# Patient Record
Sex: Female | Born: 1945 | Race: White | Hispanic: No | Marital: Married | Smoking: Never smoker
Health system: Southern US, Community
[De-identification: ages and names within clinical notes are randomized; demographics above are authoritative.]

## PROBLEM LIST (undated history)

## (undated) DIAGNOSIS — I10 Essential (primary) hypertension: Secondary | ICD-10-CM

## (undated) DIAGNOSIS — M351 Other overlap syndromes: Secondary | ICD-10-CM

## (undated) DIAGNOSIS — K219 Gastro-esophageal reflux disease without esophagitis: Secondary | ICD-10-CM

## (undated) HISTORY — PX: HEMORROIDECTOMY: SUR656

## (undated) HISTORY — PX: ABDOMINAL HYSTERECTOMY: SHX81

---

## 1997-10-07 ENCOUNTER — Other Ambulatory Visit: Admission: RE | Admit: 1997-10-07 | Discharge: 1997-10-07 | Payer: Self-pay | Admitting: *Deleted

## 2000-07-19 ENCOUNTER — Encounter: Payer: Self-pay | Admitting: Family Medicine

## 2000-07-19 ENCOUNTER — Encounter: Admission: RE | Admit: 2000-07-19 | Discharge: 2000-07-19 | Payer: Self-pay | Admitting: Family Medicine

## 2000-10-02 ENCOUNTER — Ambulatory Visit (HOSPITAL_COMMUNITY): Admission: RE | Admit: 2000-10-02 | Discharge: 2000-10-02 | Payer: Self-pay | Admitting: Gastroenterology

## 2000-11-14 ENCOUNTER — Other Ambulatory Visit: Admission: RE | Admit: 2000-11-14 | Discharge: 2000-11-14 | Payer: Self-pay | Admitting: *Deleted

## 2000-11-24 ENCOUNTER — Encounter: Payer: Self-pay | Admitting: Family Medicine

## 2000-11-24 ENCOUNTER — Encounter: Admission: RE | Admit: 2000-11-24 | Discharge: 2000-11-24 | Payer: Self-pay | Admitting: Family Medicine

## 2001-03-07 ENCOUNTER — Encounter: Payer: Self-pay | Admitting: Family Medicine

## 2001-03-07 ENCOUNTER — Encounter: Admission: RE | Admit: 2001-03-07 | Discharge: 2001-03-07 | Payer: Self-pay | Admitting: Family Medicine

## 2001-07-03 ENCOUNTER — Encounter: Admission: RE | Admit: 2001-07-03 | Discharge: 2001-07-03 | Payer: Self-pay | Admitting: Family Medicine

## 2001-07-03 ENCOUNTER — Encounter: Payer: Self-pay | Admitting: Family Medicine

## 2001-07-25 ENCOUNTER — Inpatient Hospital Stay (HOSPITAL_COMMUNITY): Admission: RE | Admit: 2001-07-25 | Discharge: 2001-07-27 | Payer: Self-pay | Admitting: *Deleted

## 2003-01-09 ENCOUNTER — Ambulatory Visit (HOSPITAL_COMMUNITY): Admission: RE | Admit: 2003-01-09 | Discharge: 2003-01-09 | Payer: Self-pay | Admitting: *Deleted

## 2003-10-30 ENCOUNTER — Ambulatory Visit (HOSPITAL_COMMUNITY): Admission: RE | Admit: 2003-10-30 | Discharge: 2003-10-30 | Payer: Self-pay | Admitting: Gastroenterology

## 2004-11-08 IMAGING — CT CT CHEST W/ CM
1 series · 16 of 32 positions shown, 20 images · IV contrast ([ID] OMNI 300)
Comparison: none

CLINICAL DATA: Chest pain.  Borderline hypertension. 
 CT SCAN OF THE CHEST WITH CONTRAST
 After the intravenous injection of 100 cc of Omnipaque 300, a series of scans of the entire chest are made without previous films for comparison and show multiple gallstones within the gallbladder.  These gallstones could possibly be accounting for pain in the right upper chest or posterior subscapular area.  
 The heart is mildly enlarged.  There is no pericardial or pleural effusion.  The lungs and tracheobronchial tree appear normal.  The bony thorax appears normal except for small anterior hypertrophic spurs.  No abnormal lymph nodes are seen.  The aorta and other major vessels appear normal.  
 IMPRESSION
 Multiple gallstones within the gallbladder. 
 Mild cardiomegaly.  No significant abnormality of the lungs, major vessels or bony thorax.

[Series 2: routine chest · axial · 0.62mm/px · z∈[-344,-69]mm · 16 of 61 slices shown, 20 images]
[im 3/61  mediastinal]
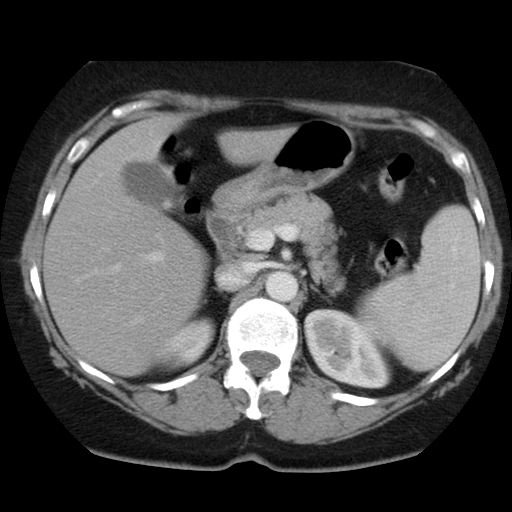
[im 3/61  lung]
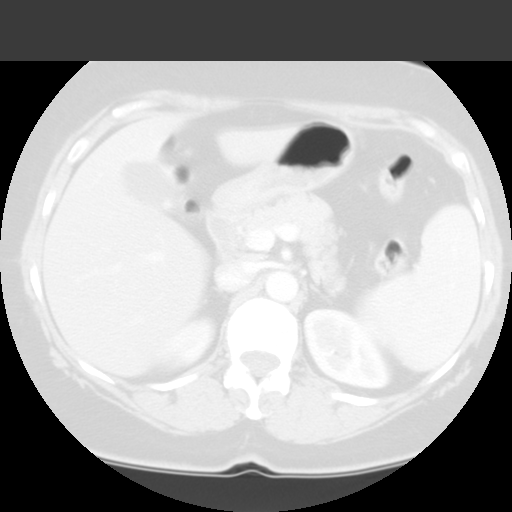
[im 7/61  lung]
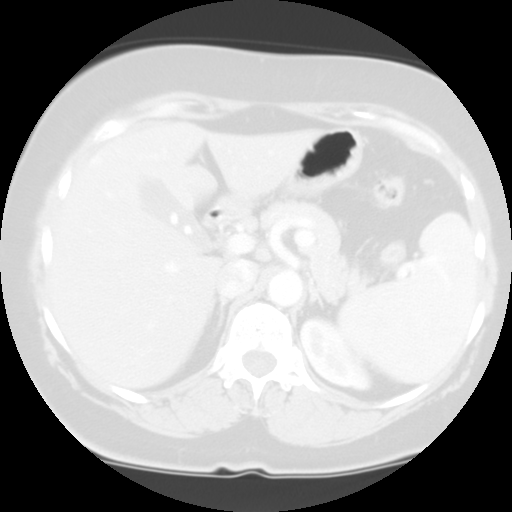
[im 12/61  lung]
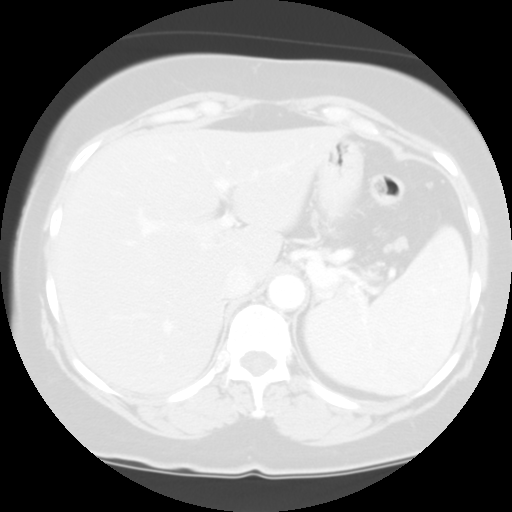
[im 14/61  lung]
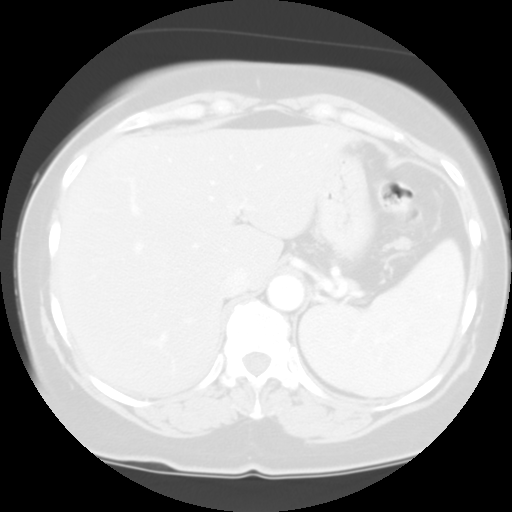
[im 18/61  mediastinal]
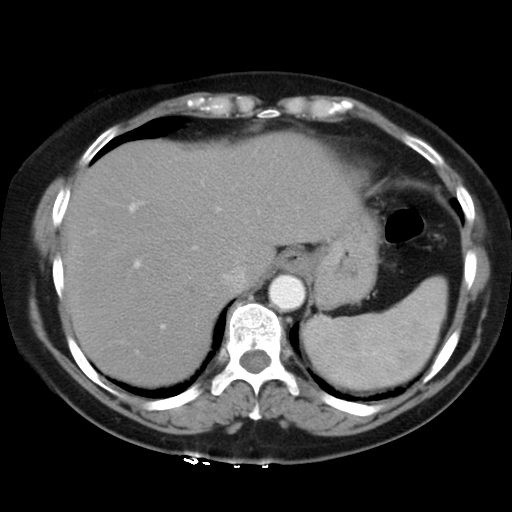
[im 18/61  lung]
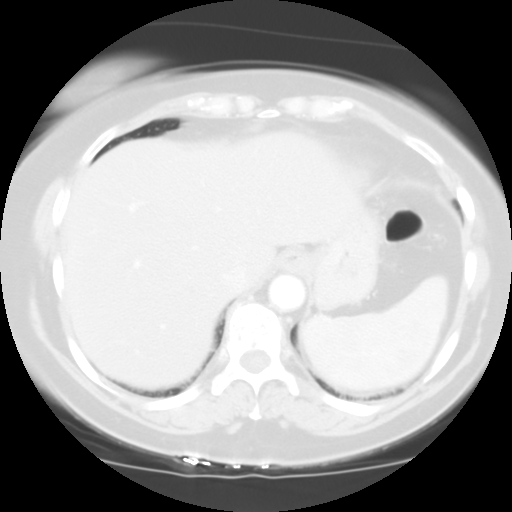
[im 23/61  lung]
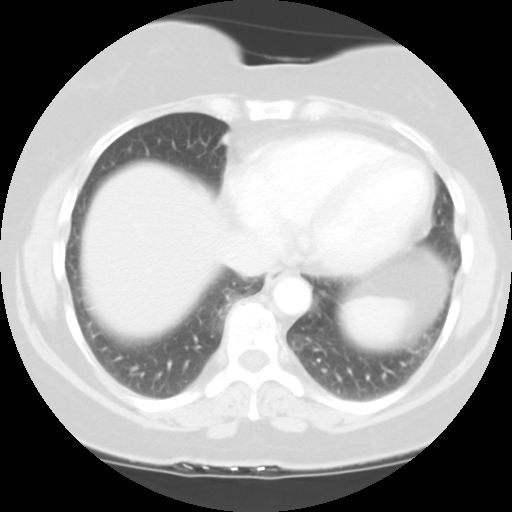
[im 27/61  lung]
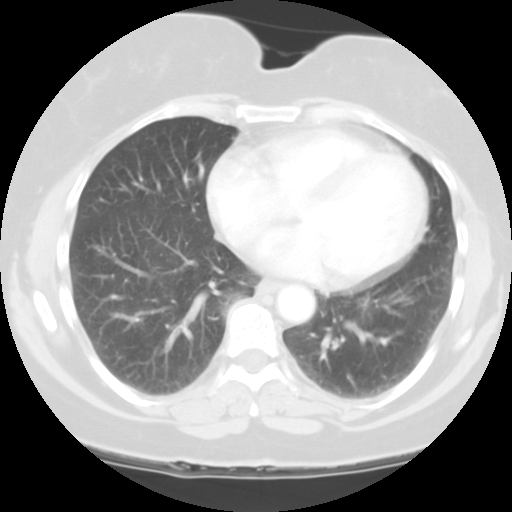
[im 30/61  lung]
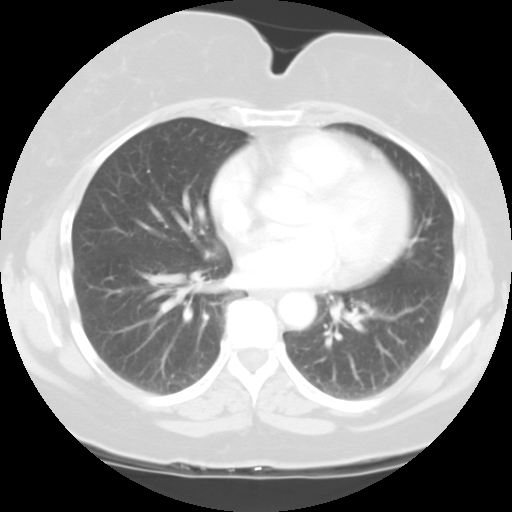
[im 32/61  mediastinal]
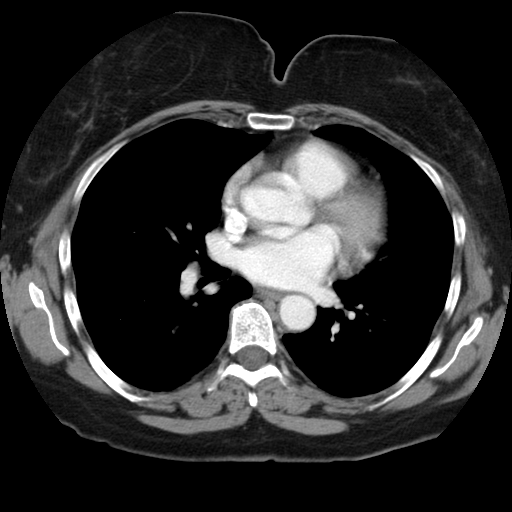
[im 32/61  lung]
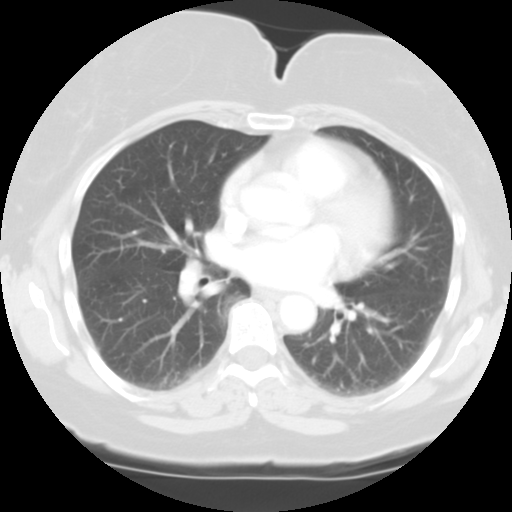
[im 34/61  lung]
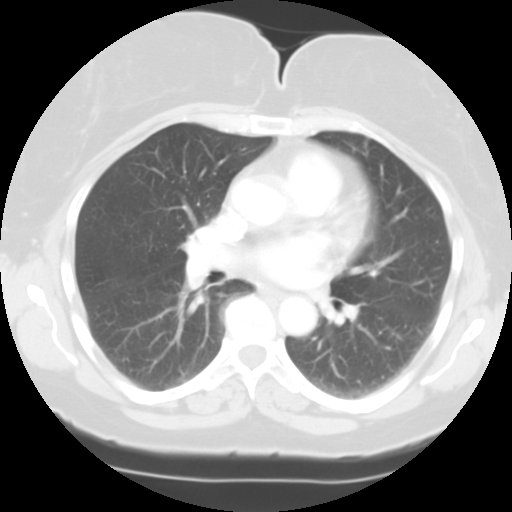
[im 37/61  lung]
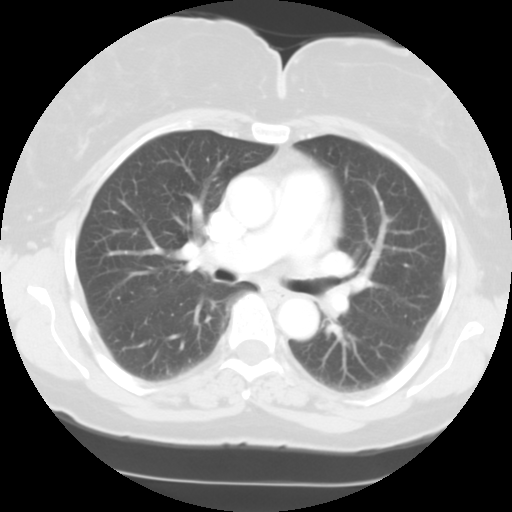
[im 41/61  lung]
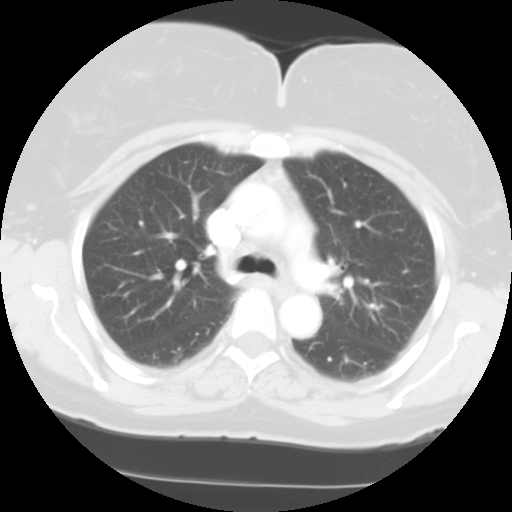
[im 45/61  mediastinal]
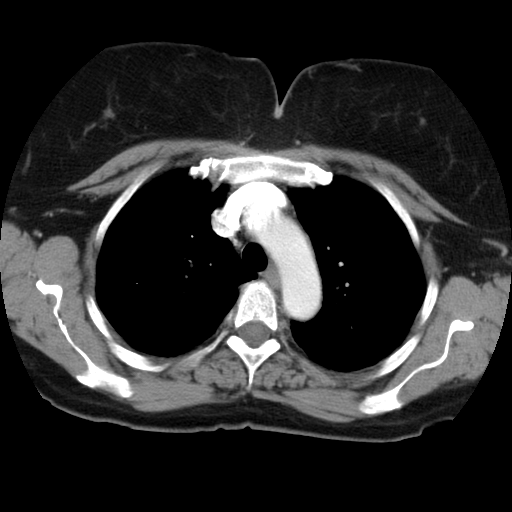
[im 45/61  lung]
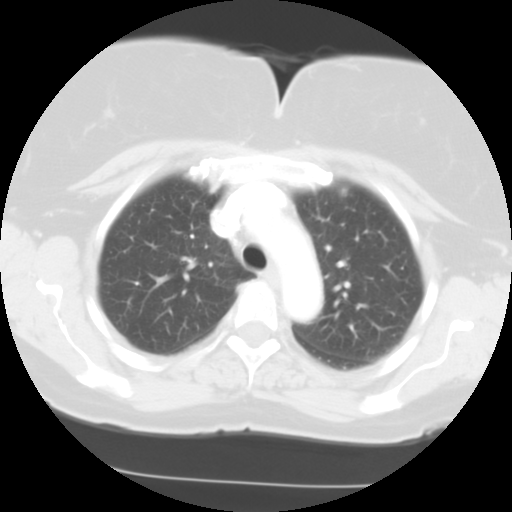
[im 49/61  lung]
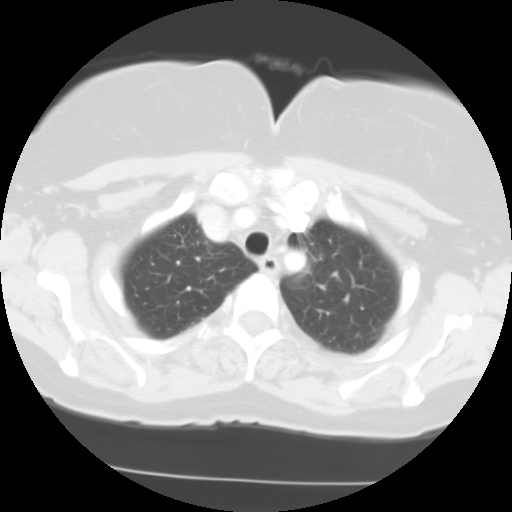
[im 54/61  lung]
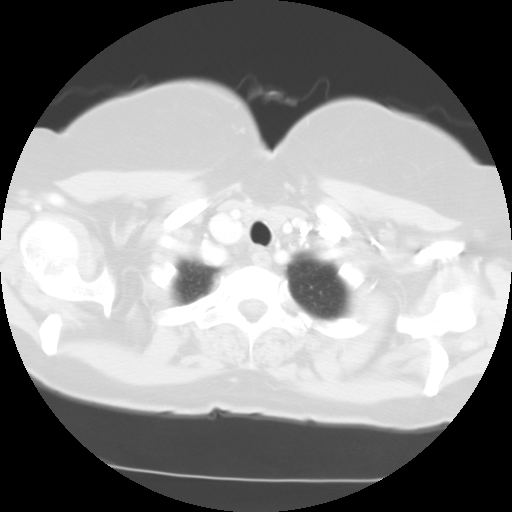
[im 58/61  lung]
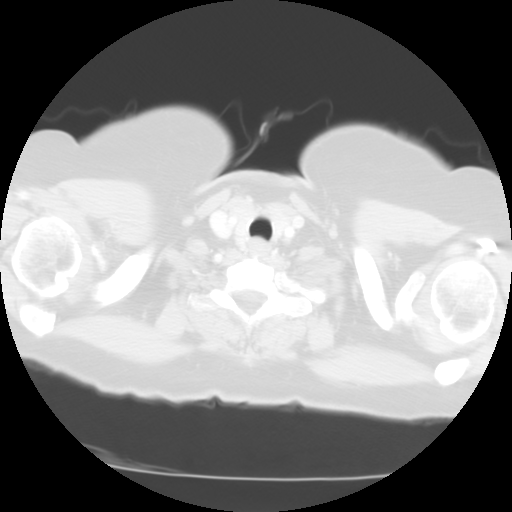

[16 of 32 positions shown; findings below may reference images not displayed]

## 2020-10-28 ENCOUNTER — Encounter (HOSPITAL_COMMUNITY): Payer: Self-pay

## 2020-10-28 ENCOUNTER — Emergency Department (HOSPITAL_COMMUNITY): Payer: Medicare Other

## 2020-10-28 ENCOUNTER — Other Ambulatory Visit: Payer: Self-pay

## 2020-10-28 ENCOUNTER — Observation Stay (HOSPITAL_COMMUNITY)
Admission: EM | Admit: 2020-10-28 | Discharge: 2020-10-30 | Disposition: A | Discharge status: Home / Self Care | Payer: Medicare Other | Attending: Internal Medicine | Admitting: Internal Medicine

## 2020-10-28 DIAGNOSIS — I2510 Atherosclerotic heart disease of native coronary artery without angina pectoris: Secondary | ICD-10-CM | POA: Diagnosis not present

## 2020-10-28 DIAGNOSIS — Z20822 Contact with and (suspected) exposure to covid-19: Secondary | ICD-10-CM | POA: Diagnosis not present

## 2020-10-28 DIAGNOSIS — Z79899 Other long term (current) drug therapy: Secondary | ICD-10-CM | POA: Insufficient documentation

## 2020-10-28 DIAGNOSIS — M351 Other overlap syndromes: Secondary | ICD-10-CM

## 2020-10-28 DIAGNOSIS — Z951 Presence of aortocoronary bypass graft: Secondary | ICD-10-CM | POA: Diagnosis not present

## 2020-10-28 DIAGNOSIS — E039 Hypothyroidism, unspecified: Secondary | ICD-10-CM | POA: Diagnosis not present

## 2020-10-28 DIAGNOSIS — Z7982 Long term (current) use of aspirin: Secondary | ICD-10-CM | POA: Diagnosis not present

## 2020-10-28 DIAGNOSIS — J45909 Unspecified asthma, uncomplicated: Secondary | ICD-10-CM | POA: Diagnosis not present

## 2020-10-28 DIAGNOSIS — R079 Chest pain, unspecified: Secondary | ICD-10-CM | POA: Diagnosis not present

## 2020-10-28 DIAGNOSIS — I10 Essential (primary) hypertension: Secondary | ICD-10-CM | POA: Diagnosis not present

## 2020-10-28 DIAGNOSIS — R0789 Other chest pain: Principal | ICD-10-CM | POA: Insufficient documentation

## 2020-10-28 HISTORY — DX: Gastro-esophageal reflux disease without esophagitis: K21.9

## 2020-10-28 HISTORY — DX: Other overlap syndromes: M35.1

## 2020-10-28 HISTORY — DX: Essential (primary) hypertension: I10

## 2020-10-28 LAB — CBC WITH DIFFERENTIAL/PLATELET
Abs Immature Granulocytes: 0.03 10*3/uL (ref 0.00–0.07)
Basophils Absolute: 0.1 10*3/uL (ref 0.0–0.1)
Basophils Relative: 1 %
Eosinophils Absolute: 0.2 10*3/uL (ref 0.0–0.5)
Eosinophils Relative: 2 %
HCT: 38.9 % (ref 36.0–46.0)
Hemoglobin: 12.6 g/dL (ref 12.0–15.0)
Immature Granulocytes: 0 %
Lymphocytes Relative: 19 %
Lymphs Abs: 1.7 10*3/uL (ref 0.7–4.0)
MCH: 28.9 pg (ref 26.0–34.0)
MCHC: 32.4 g/dL (ref 30.0–36.0)
MCV: 89.2 fL (ref 80.0–100.0)
Monocytes Absolute: 0.8 10*3/uL (ref 0.1–1.0)
Monocytes Relative: 9 %
Neutro Abs: 6 10*3/uL (ref 1.7–7.7)
Neutrophils Relative %: 69 %
Platelets: 241 10*3/uL (ref 150–400)
RBC: 4.36 MIL/uL (ref 3.87–5.11)
RDW: 14.6 % (ref 11.5–15.5)
WBC: 8.8 10*3/uL (ref 4.0–10.5)
nRBC: 0 % (ref 0.0–0.2)

## 2020-10-28 LAB — RESP PANEL BY RT-PCR (FLU A&B, COVID) ARPGX2
Influenza A by PCR: NEGATIVE
Influenza B by PCR: NEGATIVE
SARS Coronavirus 2 by RT PCR: NEGATIVE

## 2020-10-28 LAB — COMPREHENSIVE METABOLIC PANEL
ALT: 33 U/L (ref 0–44)
AST: 27 U/L (ref 15–41)
Albumin: 3.7 g/dL (ref 3.5–5.0)
Alkaline Phosphatase: 82 U/L (ref 38–126)
Anion gap: 7 (ref 5–15)
BUN: 10 mg/dL (ref 8–23)
CO2: 26 mmol/L (ref 22–32)
Calcium: 9.3 mg/dL (ref 8.9–10.3)
Chloride: 104 mmol/L (ref 98–111)
Creatinine, Ser: 0.58 mg/dL (ref 0.44–1.00)
GFR, Estimated: >60 mL/min (ref >60)
Glucose, Bld: 93 mg/dL (ref 70–99)
Potassium: 4.3 mmol/L (ref 3.5–5.1)
Sodium: 137 mmol/L (ref 135–145)
Total Bilirubin: 1.1 mg/dL (ref 0.3–1.2)
Total Protein: 7 g/dL (ref 6.5–8.1)

## 2020-10-28 LAB — TROPONIN I (HIGH SENSITIVITY)
Troponin I (High Sensitivity): 5 ng/L (ref <18)
Troponin I (High Sensitivity): 6 ng/L (ref <18)

## 2020-10-28 MED ORDER — MOMETASONE FURO-FORMOTEROL FUM 200-5 MCG/ACT IN AERO
2.0000 | INHALATION_SPRAY | Freq: Two times a day (BID) | RESPIRATORY_TRACT | Status: DC
  Administered 2020-10-28 – 2020-10-30 (×4): 2 via RESPIRATORY_TRACT
  Filled 2020-10-28: qty 8.8

## 2020-10-28 MED ORDER — LEVOTHYROXINE SODIUM 88 MCG PO TABS
88.0000 ug | ORAL_TABLET | Freq: Every day | ORAL | Status: DC
  Administered 2020-10-29 – 2020-10-30 (×2): 88 ug via ORAL
  Filled 2020-10-28 (×2): qty 1

## 2020-10-28 MED ORDER — MONTELUKAST SODIUM 10 MG PO TABS
10.0000 mg | ORAL_TABLET | Freq: Every day | ORAL | Status: DC
  Administered 2020-10-28 – 2020-10-29 (×2): 10 mg via ORAL
  Filled 2020-10-28 (×3): qty 1

## 2020-10-28 MED ORDER — NITROGLYCERIN 2 % TD OINT
1.0000 [in_us] | TOPICAL_OINTMENT | Freq: Four times a day (QID) | TRANSDERMAL | Status: DC
  Administered 2020-10-28 – 2020-10-30 (×6): 1 [in_us] via TOPICAL
  Filled 2020-10-28 (×2): qty 1
  Filled 2020-10-28: qty 30
  Filled 2020-10-28: qty 1

## 2020-10-28 MED ORDER — ONDANSETRON HCL 4 MG/2ML IJ SOLN: 4.0000 mg | Freq: Four times a day (QID) | INTRAMUSCULAR | Status: DC | PRN

## 2020-10-28 MED ORDER — AZELASTINE HCL 0.1 % NA SOLN
1.0000 | Freq: Two times a day (BID) | NASAL | Status: DC
  Administered 2020-10-30: 1 via NASAL

## 2020-10-28 MED ORDER — AMLODIPINE BESYLATE 5 MG PO TABS
5.0000 mg | ORAL_TABLET | Freq: Every day | ORAL | Status: DC
  Administered 2020-10-29 – 2020-10-30 (×2): 5 mg via ORAL
  Filled 2020-10-28 (×2): qty 1

## 2020-10-28 MED ORDER — IRBESARTAN 300 MG PO TABS
150.0000 mg | ORAL_TABLET | Freq: Every day | ORAL | Status: DC
  Administered 2020-10-30: 150 mg via ORAL
  Filled 2020-10-28 (×2): qty 1

## 2020-10-28 MED ORDER — ASPIRIN 325 MG PO TABS
325.0000 mg | ORAL_TABLET | Freq: Once | ORAL | Status: AC
  Administered 2020-10-28: 325 mg via ORAL
  Filled 2020-10-28: qty 1

## 2020-10-28 MED ORDER — VITAMIN D 25 MCG (1000 UNIT) PO TABS
1000.0000 [IU] | ORAL_TABLET | Freq: Every day | ORAL | Status: DC
  Administered 2020-10-29 – 2020-10-30 (×2): 1000 [IU] via ORAL
  Filled 2020-10-28 (×2): qty 1

## 2020-10-28 MED ORDER — ACETAMINOPHEN 325 MG PO TABS
325.0000 mg | ORAL_TABLET | Freq: Once | ORAL | Status: AC
  Administered 2020-10-28: 325 mg via ORAL
  Filled 2020-10-28: qty 1

## 2020-10-28 MED ORDER — ASPIRIN 81 MG PO CHEW
81.0000 mg | CHEWABLE_TABLET | Freq: Every day | ORAL | Status: DC
  Administered 2020-10-29 – 2020-10-30 (×2): 81 mg via ORAL
  Filled 2020-10-28 (×2): qty 1

## 2020-10-28 MED ORDER — ENOXAPARIN SODIUM 40 MG/0.4ML IJ SOSY
40.0000 mg | PREFILLED_SYRINGE | INTRAMUSCULAR | Status: DC
  Administered 2020-10-28 – 2020-10-29 (×2): 40 mg via SUBCUTANEOUS
  Filled 2020-10-28 (×2): qty 0.4

## 2020-10-28 MED ORDER — NITROGLYCERIN 0.4 MG SL SUBL
0.4000 mg | SUBLINGUAL_TABLET | Freq: Once | SUBLINGUAL | Status: AC
  Administered 2020-10-28: 0.4 mg via SUBLINGUAL
  Filled 2020-10-28: qty 1

## 2020-10-28 MED ORDER — IOHEXOL 350 MG/ML SOLN
75.0000 mL | Freq: Once | INTRAVENOUS | Status: AC | PRN
  Administered 2020-10-28: 75 mL via INTRAVENOUS

## 2020-10-28 MED ORDER — ACETAMINOPHEN 325 MG PO TABS
650.0000 mg | ORAL_TABLET | ORAL | Status: DC | PRN
  Administered 2020-10-29: 650 mg via ORAL
  Filled 2020-10-28: qty 2

## 2020-10-28 MED ORDER — PANTOPRAZOLE SODIUM 40 MG PO TBEC
40.0000 mg | DELAYED_RELEASE_TABLET | Freq: Every day | ORAL | Status: DC
  Administered 2020-10-29 – 2020-10-30 (×2): 40 mg via ORAL
  Filled 2020-10-28 (×2): qty 1

## 2020-10-28 MED ORDER — SERTRALINE HCL 50 MG PO TABS
25.0000 mg | ORAL_TABLET | Freq: Every day | ORAL | Status: DC
  Administered 2020-10-29 – 2020-10-30 (×2): 25 mg via ORAL
  Filled 2020-10-28 (×2): qty 1

## 2020-10-28 MED ORDER — HYDROXYCHLOROQUINE SULFATE 200 MG PO TABS
200.0000 mg | ORAL_TABLET | Freq: Every day | ORAL | Status: DC
  Administered 2020-10-29 – 2020-10-30 (×2): 200 mg via ORAL
  Filled 2020-10-28 (×2): qty 1

## 2020-10-28 NOTE — ED Notes (Signed)
Dr. Gardner at bedside 

## 2020-10-28 NOTE — ED Notes (Signed)
Pt given turkey sandwich bag 

## 2020-10-28 NOTE — ED Triage Notes (Signed)
Pt reports right sided chest pain since Sunday. Seen at [facility] and sent here for further eval. Pain sometimes go to her back.

## 2020-10-28 NOTE — H&P (Signed)
History and Physical    Catherine Wang EQA:[id number] DOB: [birth year] DOA: 10/28/2020  PCP: Pcp, No  Patient coming from: Home  I have personally briefly reviewed patient's old medical records in [facility]  Chief Complaint: CP  HPI: Catherine Wang is a 75 y.o. 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  No h/o CAD though her mother had h/o CAD and CABG (not sure what age).  Pt presents to ED with c/o CP.  CP is right sided, onset gradually on Sunday, constant since onset.  Radiates to back.  Positional and pleuritic in nature.  Pt has associated random episodes of significant diaphoresis where she has to change her clothes.  Did just fly back from [place] last week.  No recent illness, fevers, chills, leg pain, swelling.   ED Course: Pain initially persistent in ED, now still present but significantly improved after SL NTG.  Trop neg x2.  CTA chest: neg for PE, does have scattered coronary artery calcifications.   Review of Systems: As per HPI, otherwise all review of systems negative.  Past Medical History:  Diagnosis Date   GERD (gastroesophageal reflux disease)    Hypertension    Mixed connective tissue disease (HCC)     History reviewed. No pertinent surgical history.   reports that she does not drink alcohol and does not use drugs. No history on file for tobacco use.  No Known Allergies  Family History  Problem Relation Age of Onset   CAD Mother      Prior to Admission medications   Medication Sig Start Date End Date Taking? Authorizing Provider  amLODipine (NORVASC) 5 MG tablet Take 5 mg by mouth daily. 05/18/15  Yes [provider]  ARTIFICIAL TEAR OP Place 1 drop into both eyes daily as needed (For dry eyes).   Yes [provider]  aspirin 81 MG chewable tablet Chew 81 mg by mouth daily.   Yes [provider]  azelastine (ASTELIN) 0.1 % nasal spray Place 1 spray  into both nostrils 2 (two) times daily.   Yes [provider]  Cholecalciferol 25 MCG (1000 UT) capsule Take 1,000 Units by mouth daily.   Yes [provider]  Ferrous Sulfate (IRON PO) Take 1 tablet by mouth daily.   Yes [provider]  fluticasone-salmeterol (ADVAIR HFA) 230-21 MCG/ACT inhaler Inhale 2 puffs into the lungs 2 (two) times daily.   Yes [provider]  hydroxychloroquine (PLAQUENIL) 200 MG tablet Take 200-400 mg by mouth See admin instructions. Alternate days. Patient states she takes one tablet by mouth daily and then take two tablets by mouth the next day per patient. Patient stated she taken two tablets by mouth yesterday(10-27-20) 07/08/15  Yes [provider]  levothyroxine (SYNTHROID) 88 MCG tablet Take 88 mcg by mouth daily before breakfast.   Yes [provider]  losartan (COZAAR) 25 MG tablet Take 25 mg by mouth daily. 03/13/15  Yes [provider]  montelukast (SINGULAIR) 10 MG tablet Take 10 mg by mouth at bedtime.   Yes [provider]  olmesartan (BENICAR) 20 MG tablet Take 20 mg by mouth daily.   Yes [provider]  Omega-3 Fatty Acids (FISH OIL PO) Take 1 tablet by mouth daily.   Yes [provider]  pantoprazole (PROTONIX) 40 MG tablet Take 40 mg by mouth daily.   Yes [provider]  sertraline (ZOLOFT) 25 MG tablet Take 25 mg by mouth  daily.   Yes [provider]    Physical Exam: Vitals:   10/28/20 1900 10/28/20 2000 10/28/20 2005 10/28/20 2045  BP: (!) 156/79 (!) 155/112 (!) 181/79 (!) 171/85  Pulse: 69 71 71 73  Resp: 15 19 18 17   Temp:      TempSrc:      SpO2: 96% 95% 99% 96%    Constitutional: NAD, calm, comfortable Eyes: PERRL, lids and conjunctivae normal ENMT: Mucous membranes are moist. Posterior pharynx clear of any exudate or lesions.Normal dentition.  Neck: normal, supple, no masses, no thyromegaly Respiratory: clear to auscultation  bilaterally, no wheezing, no crackles. Normal respiratory effort. No accessory muscle use.  Cardiovascular: Regular rate and rhythm, no murmurs / rubs / gallops. No extremity edema. 2+ pedal pulses. No carotid bruits.  Abdomen: no tenderness, no masses palpated. No hepatosplenomegaly. Bowel sounds positive.  Musculoskeletal: no clubbing / cyanosis. No joint deformity upper and lower extremities. Good ROM, no contractures. Normal muscle tone.  Skin: no rashes, lesions, ulcers. No induration Neurologic: CN 2-12 grossly intact. Sensation intact, DTR normal. Strength 5/5 in all 4.  Psychiatric: Normal judgment and insight. Alert and oriented x 3. ? Mild memory deficits (frequently looks to husband to supply answers to some questions).   Labs on Admission: I have personally reviewed following labs and imaging studies  CBC: Recent Labs  Lab 10/28/20 1416  WBC 8.8  NEUTROABS 6.0  HGB 12.6  HCT 38.9  MCV 89.2  PLT 241   Basic Metabolic Panel: Recent Labs  Lab 10/28/20 1416  NA 137  K 4.3  CL 104  CO2 26  GLUCOSE 93  BUN 10  CREATININE 0.58  CALCIUM 9.3   GFR: CrCl cannot be calculated (Unknown ideal weight.). Liver Function Tests: Recent Labs  Lab 10/28/20 1416  AST 27  ALT 33  ALKPHOS 82  BILITOT 1.1  PROT 7.0  ALBUMIN 3.7   No results for input(s): LIPASE, AMYLASE in the last 168 hours. No results for input(s): AMMONIA in the last 168 hours. Coagulation Profile: No results for input(s): INR, PROTIME in the last 168 hours. Cardiac Enzymes: No results for input(s): CKTOTAL, CKMB, CKMBINDEX, TROPONINI in the last 168 hours. BNP (last 3 results) No results for input(s): PROBNP in the last 8760 hours. HbA1C: No results for input(s): HGBA1C in the last 72 hours. CBG: No results for input(s): GLUCAP in the last 168 hours. Lipid Profile: No results for input(s): CHOL, HDL, LDLCALC, TRIG, CHOLHDL, LDLDIRECT in the last 72 hours. Thyroid Function Tests: No results for  input(s): TSH, T4TOTAL, FREET4, T3FREE, THYROIDAB in the last 72 hours. Anemia Panel: No results for input(s): VITAMINB12, FOLATE, FERRITIN, TIBC, IRON, RETICCTPCT in the last 72 hours. Urine analysis: No results found for: COLORURINE, APPEARANCEUR, LABSPEC, PHURINE, GLUCOSEU, HGBUR, BILIRUBINUR, KETONESUR, PROTEINUR, UROBILINOGEN, NITRITE, LEUKOCYTESUR  Radiological Exams on Admission: DG Chest 2 View  Result Date: 10/28/2020 CLINICAL DATA:  Chest pain EXAM: CHEST - 2 VIEW COMPARISON:  Chest CT dated December 09, 2002 FINDINGS: Heart is upper limits of normal in size. Indeterminate rounded density located between the left lateral trachea and aortic arch. Bibasilar atelectasis. No focal consolidation. Blunting of the bilateral costophrenic angles possibly due to trace pleural effusions versus pleural thickening. IMPRESSION: Indeterminate rounded density located between the left lateral trachea and aortic arch. Recommend further evaluation with chest CT. Electronically Signed   By: December 11, 2002 M.D.   On: 10/28/2020 15:11   CT Angio Chest PE W and/or Wo Contrast  Addendum Date: 10/28/2020   ADDENDUM REPORT: 10/28/2020 19:35 ADDENDUM: Request made to specifically address the abnormal finding on radiography. There is no mass or adenopathy to correspond to the described left paratracheal opacity on radiography. Suspect that the finding was due to confluence of vascular shadows. 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 Electronically Signed   By: Jasmine PangKim  Fujinaga M.D.   On: 10/28/2020 19:35   Result Date: 10/28/2020 CLINICAL DATA:  RIGHT-side chest pain since Sunday, some pain goes to back, no history of blood clots. History hypertension, asthma EXAM: CT ANGIOGRAPHY CHEST WITH CONTRAST TECHNIQUE: Multidetector CT imaging of the chest was performed using the standard protocol during bolus administration of intravenous contrast. Multiplanar CT image reconstructions and MIPs were obtained to evaluate  the vascular anatomy. CONTRAST:  75mL OMNIPAQUE IOHEXOL 350 MG/ML SOLN IV COMPARISON:  CT chest 01/09/2003 FINDINGS: Cardiovascular: Atherosclerotic calcifications aorta, coronary arteries, proximal great vessels. And normal caliber without aneurysm or dissection. Heart unremarkable. No pericardial effusion. Pulmonary arteries well opacified and patent. No evidence of pulmonary embolism. Mediastinum/Nodes: Esophagus unremarkable. No thoracic adenopathy. Base of cervical region normal appearance. Lungs/Pleura: Dependent atelectasis in lower lobes greater on RIGHT. Minimal atelectasis RIGHT upper lobe. Lungs otherwise clear. No definite infiltrate, pleural effusion, or pneumothorax. Upper Abdomen: Visualized upper abdomen unremarkable. Musculoskeletal: Cystic degenerative changes at LEFT glenoid. No acute osseous findings. Review of the MIP images confirms the above findings. IMPRESSION: No evidence of pulmonary embolism. Scattered atelectasis in the lungs greatest in RIGHT lower lobe. Scattered atherosclerotic calcifications including coronary arteries. Aortic Atherosclerosis (ICD10-I70.0). Electronically Signed: By: Ulyses SouthwardMark  Boles M.D. On: 10/28/2020 18:57    EKG: Independently reviewed.  ? Age indeterminate anterior infarct.  Not particularly impressive to me for repol disturbance though no-prior available for comparison.  Assessment/Plan Principal Problem:   Chest pain, rule out acute myocardial infarction Active Problems:   HTN (hypertension)   Mixed connective tissue disease (HCC)    CP r/o - Sounds pleuritic but ? Atypical ischemia symptoms vs HTN urgency given the improvement with SL NTG in ED today CP obs pathway Serial trops Tele monitor ASA 325x1 now then cont home ASA 81 daily NTG paste NPO after MN Cards eval in AM HTN - Cont home BP meds NTG paste as above MCTD - Cont plaquenil  DVT prophylaxis: Lovenox Code Status: Full Family Communication: Husband at bedside Disposition  Plan: Home after cleared by cards Consults called: Message sent to P. Johna Sheriffrent for AM cards eval Admission status: Place in obs     Deran Barro, [place] M. DO [facility]  How to contact the [facility] Attending or Consulting provider 7A - 7P or covering provider during after hours 7P -7A, for this patient?  Check the care team in [facility] and look for a) attending/consulting [facility] provider listed and b) the [facility] team listed Log into [email or web address]  Amion Physician Scheduling and messaging for groups and whole hospitals  On call and physician scheduling software for group practices, residents, hospitalists and other medical providers for call, clinic, rotation and shift schedules. OnCall [employer] is a hospital-wide system for scheduling doctors and paging doctors on call. EasyPlot is for scientific plotting and data analysis.  [email or web address]  and use [place]'s universal password to access. If you do not have the password, please contact the hospital operator.  Locate the [facility] provider you are looking for under [facility] and page to a number that you can be directly reached. If you still have difficulty reaching the provider, please  page the [facility] (Director on Call) for the Hospitalists listed on amion for assistance.  10/28/2020, 9:28 PM

## 2020-10-28 NOTE — ED Provider Notes (Signed)
Emergency Medicine Provider Triage Evaluation Note  Catherine Wang , a 75 y.o. female  was evaluated in triage.  Pt complains of right sided chest pain, nausea, shortness of breath. She reports the pain radiates into her back. She reports it is better with rest, worse with activity. It started Sunday but is worse today. She denies history of previous heart attack, diabetes, tobacco use. Patient also has an abnormal EKG showing potential infarct.  Review of Systems  Positive: Chest pain, shortness of breath Negative: Syncope, leg swelling  Physical Exam  BP (!) 157/92   Pulse 80   Temp 98.1 F (36.7 C) (Oral)   Resp 18   SpO2 96%  Gen:   Awake, no distress  Resp:  Normal effort MSK:   Moves extremities without difficulty Other:  Normal rate and rhythm, patient endorses TTP over chest wall on right  Medical Decision Making  Medically screening exam initiated at 2:09 PM.  Appropriate orders placed.  Catherine Wang was informed that the remainder of the evaluation will be completed by another provider, this initial triage assessment does not replace that evaluation, and the importance of remaining in the ED until their evaluation is complete.  Chest pain, shortness of breath   Olene Floss, PA-C 10/28/20 1411    Mancel Bale, MD 10/29/20 1001

## 2020-10-28 NOTE — ED Provider Notes (Signed)
[facility] EMERGENCY DEPARTMENT Provider Note   CSN: [id number] Arrival date & time: 10/28/20  1343     History Chief Complaint  Patient presents with   Chest Pain    Catherine Wang is a 75 y.o. female.  Patient with past medical history of hypertension presents today with complaint of chest pain. Patient states that pain came on gradually Sunday and has been constant since. Denies any recent exertion or overuse. Pain is located on the right side, radiates to her back, and is positional and pleuritic in nature. Patient also states that she has associated random episodes of significant diaphoresis where she has to change her clothes a 2-3 times a week over the past few months. Of note, patient states she just flew back from [place] last week. Endorses nausea but no vomiting or diarrhea. Denies any recent illness, fevers, chills, leg pain or swelling.   The history is provided by the patient. No language interpreter was used.  Chest Pain Pain location:  R chest Pain quality: sharp   Pain radiates to:  Mid back Onset quality:  Gradual Associated symptoms: diaphoresis and nausea   Associated symptoms: no abdominal pain, no cough, no fever, no palpitations, no shortness of breath, no vomiting and no weakness       Past Medical History:  Diagnosis Date   Hypertension     There are no problems to display for this patient.   History reviewed. No pertinent surgical history.   OB History   No obstetric history on file.     No family history on file.     Home Medications Prior to Admission medications   Not on File    Allergies    Patient has no allergy information on record.  Review of Systems   Review of Systems  Constitutional:  Positive for diaphoresis. Negative for chills and fever.  HENT:  Negative for congestion and rhinorrhea.   Respiratory:  Negative for cough and shortness of breath.   Cardiovascular:  Positive for chest pain. Negative  for palpitations and leg swelling.  Gastrointestinal:  Positive for nausea. Negative for abdominal distention, abdominal pain, diarrhea and vomiting.  Endocrine: Positive for heat intolerance.  Neurological:  Negative for seizures, syncope, speech difficulty and weakness.  Psychiatric/Behavioral:  Negative for confusion.   All other systems reviewed and are negative.  Physical Exam Updated Vital Signs BP (!) 138/119   Pulse 70   Temp 98.1 F (36.7 C) (Oral)   Resp 13   LMP  (LMP Unknown)   SpO2 98%   Physical Exam Vitals and nursing note reviewed.  Constitutional:      General: She is not in acute distress.    Appearance: She is well-developed. She is not ill-appearing, toxic-appearing or diaphoretic.  HENT:     Head: Normocephalic.  Eyes:     General:        Right eye: No discharge.        Left eye: No discharge.  Cardiovascular:     Rate and Rhythm: Normal rate and regular rhythm.     Pulses:          Carotid pulses are 2+ on the right side and 2+ on the left side.      Radial pulses are 2+ on the right side and 2+ on the left side.       Dorsalis pedis pulses are 2+ on the right side and 2+ on the left side.  Posterior tibial pulses are 2+ on the right side and 2+ on the left side.     Heart sounds: Normal heart sounds. No murmur heard. Pulmonary:     Effort: Pulmonary effort is normal. No tachypnea, accessory muscle usage or respiratory distress.     Breath sounds: Normal breath sounds. No stridor.  Chest:     Chest wall: Tenderness present. No deformity, crepitus or edema.     Comments: Tenderness noted to left lower chest region Abdominal:     General: Bowel sounds are normal. There is no abdominal bruit.     Palpations: Abdomen is soft. There is no fluid wave.  Musculoskeletal:     Cervical back: Normal range of motion.     Right lower leg: No tenderness. No edema.     Left lower leg: No tenderness. No edema.  Skin:    General: Skin is warm and dry.   Neurological:     General: No focal deficit present.     Mental Status: She is alert.  Psychiatric:        Mood and Affect: Mood normal.        Behavior: Behavior normal.    ED Results / Procedures / Treatments   Labs (all labs ordered are listed, but only abnormal results are displayed) Labs Reviewed  CBC WITH DIFFERENTIAL/PLATELET  COMPREHENSIVE METABOLIC PANEL  TROPONIN I (HIGH SENSITIVITY)    EKG None  Radiology DG Chest 2 View  Result Date: 10/28/2020 CLINICAL DATA:  Chest pain EXAM: CHEST - 2 VIEW COMPARISON:  Chest CT dated December 09, 2002 FINDINGS: Heart is upper limits of normal in size. Indeterminate rounded density located between the left lateral trachea and aortic arch. Bibasilar atelectasis. No focal consolidation. Blunting of the bilateral costophrenic angles possibly due to trace pleural effusions versus pleural thickening. IMPRESSION: Indeterminate rounded density located between the left lateral trachea and aortic arch. Recommend further evaluation with chest CT. Electronically Signed   By: Allegra Lai M.D.   On: 10/28/2020 15:11    Procedures Procedures   Medications Ordered in ED Medications  acetaminophen (TYLENOL) tablet 650 mg (has no administration in time range)  ondansetron (ZOFRAN) injection 4 mg (has no administration in time range)  enoxaparin (LOVENOX) injection 40 mg (40 mg Subcutaneous Given 10/28/20 2047)  amLODipine (NORVASC) tablet 5 mg (has no administration in time range)  aspirin chewable tablet 81 mg (has no administration in time range)  hydroxychloroquine (PLAQUENIL) tablet 200 mg (has no administration in time range)  azelastine (ASTELIN) 0.1 % nasal spray 1 spray (has no administration in time range)  cholecalciferol (VITAMIN D3) tablet 1,000 Units (has no administration in time range)  levothyroxine (SYNTHROID) tablet 88 mcg (has no administration in time range)  mometasone-formoterol (DULERA) 200-5 MCG/ACT inhaler 2 puff (2  puffs Inhalation Given 10/28/20 2203)  pantoprazole (PROTONIX) EC tablet 40 mg (has no administration in time range)  sertraline (ZOLOFT) tablet 25 mg (has no administration in time range)  montelukast (SINGULAIR) tablet 10 mg (10 mg Oral Given 10/28/20 2202)  nitroGLYCERIN (NITROGLYN) 2 % ointment 1 inch (1 inch Topical Given 10/28/20 2204)  irbesartan (AVAPRO) tablet 150 mg (has no administration in time range)  iohexol (OMNIPAQUE) 350 MG/ML injection 75 mL (75 mLs Intravenous Contrast Given 10/28/20 1812)  acetaminophen (TYLENOL) tablet 325 mg (325 mg Oral Given 10/28/20 2002)  nitroGLYCERIN (NITROSTAT) SL tablet 0.4 mg (0.4 mg Sublingual Given 10/28/20 2003)  aspirin tablet 325 mg (325 mg Oral Given 10/28/20 2046)  ED Course  I have reviewed the triage vital signs and the nursing notes.  Pertinent labs & imaging results that were available during my care of the patient were reviewed by me and considered in my medical decision making (see chart for details).    MDM Rules/Calculators/A&P                         Given the large differential diagnosis for CIELA MAHAJAN, the decision making in this case is of high complexity.  After evaluating all of the data points in this case, the presentation of RUQAYA STRAUSS is NOT consistent with Acute Coronary Syndrome (ACS) and/or myocardial ischemia, pulmonary embolism, aortic dissection; Borhaave's, significant arrythmia, pneumothorax, cardiac tamponade, or other emergent cardiopulmonary condition.   Additionally, the presentation of Adaly Puder Hubbardis NOT consistent with flail chest, cardiac contusion, ARDS, or significant intra-thoracic or intra-abdominal bleeding.  Moreover, this presentation is NOT consistent with pneumonia, sepsis, or pyelonephritis.  Given patients age and risk factors combined with non-resolved chest pain despite intervention with Tylenol and Nitroglycerin, will admit for observation and further cardiac work-up.  This is  a shared visit with supervising physician Dr. Posey Rea who has independently evaluated patient & provided guidance in evaluation/management/disposition, in agreement with care.   Data Reviewed/Counseling: I have reviewed the patient's vital signs, nursing notes, and other relevant tests/information. I had a detailed discussion regarding the historical points, exam findings, and any diagnostic results with the patient who is also in agreement with plan.      Final Clinical Impression(s) / ED Diagnoses Final diagnoses:  Chest pain, unspecified type    Rx / DC Orders ED Discharge Orders     None        Vear Clock 10/28/20 2223    Glendora Score, MD 10/29/20 [phone]

## 2020-10-29 ENCOUNTER — Encounter (HOSPITAL_COMMUNITY): Payer: Self-pay | Admitting: Internal Medicine

## 2020-10-29 DIAGNOSIS — R079 Chest pain, unspecified: Secondary | ICD-10-CM

## 2020-10-29 DIAGNOSIS — R0789 Other chest pain: Secondary | ICD-10-CM | POA: Diagnosis not present

## 2020-10-29 LAB — LIPID PANEL
Cholesterol: 172 mg/dL (ref 0–200)
HDL: 58 mg/dL (ref >40)
LDL Cholesterol: 84 mg/dL (ref 0–99)
Total CHOL/HDL Ratio: 3 RATIO
Triglycerides: 152 mg/dL — ABNORMAL HIGH (ref <150)
VLDL: 30 mg/dL (ref 0–40)

## 2020-10-29 LAB — TSH: TSH: 1.183 u[IU]/mL (ref 0.350–4.500)

## 2020-10-29 LAB — C-REACTIVE PROTEIN: CRP: 2.1 mg/dL — ABNORMAL HIGH (ref <1.0)

## 2020-10-29 LAB — SEDIMENTATION RATE: Sed Rate: 18 mm/hr (ref 0–22)

## 2020-10-29 NOTE — ED Notes (Signed)
Walked patient to the bathroom patient did well patient is now back in bed on the monitor with family at bedside and call bell in reach  ?

## 2020-10-29 NOTE — ED Notes (Signed)
The pt has no pain  At present  Up to the br non-assisted  An occassional pvc

## 2020-10-29 NOTE — Progress Notes (Signed)
PROGRESS NOTE    Catherine Wang  ENI:[id number] DOB: 02/15/1946 DOA: 10/28/2020 PCP: Merryl Hacker, No    Brief Narrative:  Catherine Wang is a 75 year old female with past medical history significant for mixed connective tissue disease (Lupus Type), essential hypertension, GERD who presented to [facility] ED on 8/21 with right-sided anterior chest pain with mild associated shortness of breath.  Patient states symptoms initially started on Saturday, no specific activity involved.  Pain is localized under her right breast and worse with deep inspiration.  Pain is exacerbated also with movements and relieved at rest.  Patient received sublingual nitroglycerin with reported improvement of her chest discomfort.  No personal history of CAD, but mother/father with MI/CABG in the past.  In the ED, temperature 98.1 F, HR 80, RR 18, BP 157/90, SPO2 96% on room air.  Sodium 137, potassium 4.3, chloride 104, CO2 26, BUN 10, creatinine 0.58, glucose 93.  WBC 8.8, hemoglobin 12.6, platelets 241.  High sensitive troponin 5> 6.  COVID-19 PCR negative.  Influenza A/B PCR negative.  Chest x-ray with indeterminate rounded density located between left lateral trachea and aortic arch; otherwise no focal consolidation.  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  Cardiology was consulted.  Duration consulted for admission for further evaluation and management of chest pain.   Assessment & Plan:   Principal Problem:   Chest pain, rule out acute myocardial infarction Active Problems:   HTN (hypertension)   Mixed connective tissue disease ([place])   Atypical chest pain Patient presenting to the ED with intermittent chest pain, worse with  rest with associated shortness of breath.  Localized to the right chest area without radiation.  Improved with sublingual nitroglycerin received in the ED.  EKG with NSR, no concerning dynamic changes.  Patient is afebrile without leukocytosis.  Patient's pain is reproducible on palpation.  High sensitive troponins negative x2.  CTA chest negative for PE but with scattered atherosclerotic calcifications and aortic atherosclerosis.  Patient with family history of CAD/MI/CABG.  Suspect etiology likely musculoskeletal versus ACS versus inflammatory. --Cardiology following, appreciate assistance --TTE: Pending --ESR/CRP: Pending --Check lipid panel --Aspirin 81 mg p.o. daily --Continue monitor on telemetry --Cardiology to consider NM stress test versus cardiac CTA  Essential hypertension --Amlodipine 5 mg p.o. daily --Irbesartan 150 mg p.o. daily  Mixed connective tissue disease --Continue hydroxychloroquine  GERD: Protonix 40 mg p.o. daily  Depression/anxiety: Zoloft 25 mg p.o. daily  Aortic atherosclerosis Noted on imaging. --Checking lipid panel as above  Hx Asthma Follows with pulmonology outpatient, Dr. Ronnald Ramp. --Dulera substituted for home Advair --Singulair  Hypothyroidism --TSH: Pending --Levothyroxine 88 mcg p.o. daily     DVT prophylaxis: enoxaparin (LOVENOX) injection 40 mg Start: 10/28/20 2015   Code Status: Full Code Family Communication: Updated patient spouse was present at bedside this morning  Disposition Plan:  Level of care: Telemetry Cardiac Status is: Observation  The patient remains OBS appropriate and will d/c before 2 midnights.  Dispo: The patient is from: Home              Anticipated d/c is to: Home              Patient currently is not medically stable to d/c.   Difficult to place patient No  Consultants:  Cardiology  Procedures:  None  Antimicrobials:  None   Subjective: Patient seen examined bedside, resting comfortably.   Sleeping but easily arousable.  Husband present at bedside.  Continues with intermittent chest discomfort.  No other specific complaints at this time.  Husband seems to help answering many of her questions.  Awaiting cardiology evaluation.  Denies headache, no fever/chills/night sweats, no nausea/vomiting/diarrhea, no palpitations, no current shortness of breath, no abdominal pain, no weakness, no fatigue, no paresthesias.  No acute events overnight per nursing staff.  Objective: Vitals:   10/29/20 1045 10/29/20 1300 10/29/20 1400 10/29/20 1428  BP: 122/79 138/78 (!) 143/66 (!) 173/108  Pulse:  84 84 86  Resp:  19 16 20   Temp:  98.2 F (36.8 C)  98.2 F (36.8 C)  TempSrc:  Oral  Oral  SpO2:  94% 93% 94%   No intake or output data in the 24 hours ending 10/29/20 1501 There were no vitals filed for this visit.  Examination:  General exam: Appears calm and comfortable  Respiratory system: Clear to auscultation. Respiratory effort normal.  On room air Cardiovascular system: S1 & S2 heard, RRR. No JVD, murmurs, rubs, gallops or clicks. No pedal edema.  Mild tenderness to palpation right chest wall which she reports is similar type of pain Gastrointestinal system: Abdomen is nondistended, soft and nontender. No organomegaly or masses felt. Normal bowel sounds heard. Central nervous system: Alert and oriented. No focal neurological deficits. Extremities: Symmetric 5 x 5 power. Skin: No rashes, lesions or ulcers Psychiatry: Judgement and insight appear normal. Mood & affect appropriate.     Data Reviewed: I have personally reviewed following labs and imaging studies  CBC: Recent Labs  Lab 10/28/20 1416  WBC 8.8  NEUTROABS 6.0  HGB 12.6  HCT 38.9  MCV 89.2  PLT 111   Basic Metabolic Panel: Recent Labs  Lab 10/28/20 1416  NA 137  K 4.3  CL 104  CO2 26  GLUCOSE 93  BUN 10  CREATININE 0.58  CALCIUM 9.3   GFR: CrCl cannot be calculated (Unknown ideal weight.). Liver  Function Tests: Recent Labs  Lab 10/28/20 1416  AST 27  ALT 33  ALKPHOS 82  BILITOT 1.1  PROT 7.0  ALBUMIN 3.7   No results for input(s): LIPASE, AMYLASE in the last 168 hours. No results for input(s): AMMONIA in the last 168 hours. Coagulation Profile: No results for input(s): INR, PROTIME in the last 168 hours. Cardiac Enzymes: No results for input(s): CKTOTAL, CKMB, CKMBINDEX, TROPONINI in the last 168 hours. BNP (last 3 results) No results for input(s): PROBNP in the last 8760 hours. HbA1C: No results for input(s): HGBA1C in the last 72 hours. CBG: No results for input(s): GLUCAP in the last 168 hours. Lipid Profile: No results for input(s): CHOL, HDL, LDLCALC, TRIG, CHOLHDL, LDLDIRECT in the last 72 hours. Thyroid Function Tests: No results for input(s): TSH, T4TOTAL, FREET4, T3FREE, THYROIDAB in the last 72 hours. Anemia Panel: No results for input(s): VITAMINB12, FOLATE, FERRITIN, TIBC, IRON, RETICCTPCT in the last 72 hours. Sepsis Labs: No results for input(s): PROCALCITON, LATICACIDVEN in the last 168 hours.  Recent Results (from the past 240 hour(s))  Resp Panel by RT-PCR (Flu A&B, Covid) Nasopharyngeal Swab     Status: None   Collection Time: 10/28/20  8:06 PM   Specimen: Nasopharyngeal Swab; Nasopharyngeal(NP) swabs in vial transport medium  Result Value Ref Range Status   SARS Coronavirus 2 by RT PCR NEGATIVE NEGATIVE Final  Comment: (NOTE) SARS-CoV-2 target nucleic acids are NOT DETECTED.  The SARS-CoV-2 RNA is generally detectable in upper respiratory specimens during the acute phase of infection. The lowest concentration of SARS-CoV-2 viral copies this assay can detect is 138 copies/mL. A negative result does not preclude SARS-Cov-2 infection and should not be used as the sole basis for treatment or other patient management decisions. A negative result may occur with  improper specimen collection/handling, submission of specimen other than  nasopharyngeal swab, presence of viral mutation(s) within the areas targeted by this assay, and inadequate number of viral copies(<138 copies/mL). A negative result must be combined with clinical observations, patient history, and epidemiological information. The expected result is Negative.  Fact Sheet for Patients:  EntrepreneurPulse.com.au  Fact Sheet for Healthcare Providers:  IncredibleEmployment.be  This test is no t yet approved or cleared by the [place] FDA and  has been authorized for detection and/or diagnosis of SARS-CoV-2 by FDA under an Emergency Use Authorization (EUA). This EUA will remain  in effect (meaning this test can be used) for the duration of the COVID-19 declaration under Section 564(b)(1) of the Act, 21 U.S.C.section 360bbb-3(b)(1), unless the authorization is terminated  or revoked sooner.       Influenza A by PCR NEGATIVE NEGATIVE Final   Influenza B by PCR NEGATIVE NEGATIVE Final    Comment: (NOTE) The Xpert Xpress SARS-CoV-2/FLU/RSV plus assay is intended as an aid in the diagnosis of influenza from Nasopharyngeal swab specimens and should not be used as a sole basis for treatment. Nasal washings and aspirates are unacceptable for Xpert Xpress SARS-CoV-2/FLU/RSV testing.  Fact Sheet for Patients: EntrepreneurPulse.com.au  Fact Sheet for Healthcare Providers: IncredibleEmployment.be  This test is not yet approved or cleared by the [place] FDA and has been authorized for detection and/or diagnosis of SARS-CoV-2 by FDA under an Emergency Use Authorization (EUA). This EUA will remain in effect (meaning this test can be used) for the duration of the COVID-19 declaration under Section 564(b)(1) of the Act, 21 U.S.C. section 360bbb-3(b)(1), unless the authorization is terminated or revoked.  Performed at [facility], [place] [address], [place], [place] [address]           Radiology Studies: DG Chest 2 View  Result Date: 10/28/2020 CLINICAL DATA:  Chest pain EXAM: CHEST - 2 VIEW COMPARISON:  Chest CT dated December 09, 2002 FINDINGS: Heart is upper limits of normal in size. Indeterminate rounded density located between the left lateral trachea and aortic arch. Bibasilar atelectasis. No focal consolidation. Blunting of the bilateral costophrenic angles possibly due to trace pleural effusions versus pleural thickening. IMPRESSION: Indeterminate rounded density located between the left lateral trachea and aortic arch. Recommend further evaluation with chest CT. Electronically Signed   By: Yetta Glassman M.D.   On: 10/28/2020 15:11   CT Angio Chest PE W and/or Wo Contrast  Addendum Date: 10/28/2020   ADDENDUM REPORT: 10/28/2020 19:35 ADDENDUM: Request made to specifically address the abnormal finding on radiography. There is no mass or adenopathy to correspond to the described left paratracheal opacity on radiography. Suspect that the finding was due to confluence of vascular shadows. 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 Electronically Signed   By: Donavan Foil M.D.   On: 10/28/2020 19:35   Result Date: 10/28/2020 CLINICAL DATA:  RIGHT-side chest pain since Sunday, some pain goes to back, no history of blood clots. History hypertension, asthma EXAM: CT ANGIOGRAPHY CHEST WITH CONTRAST TECHNIQUE: Multidetector CT imaging of  the chest was performed using the standard protocol during bolus administration of intravenous contrast. Multiplanar CT image reconstructions and MIPs were obtained to evaluate the vascular anatomy. CONTRAST:  89m OMNIPAQUE IOHEXOL 350 MG/ML SOLN IV COMPARISON:  CT chest 01/09/2003 FINDINGS: Cardiovascular: Atherosclerotic calcifications aorta, coronary arteries, proximal great vessels. And normal caliber without aneurysm or dissection. Heart unremarkable. No pericardial effusion. Pulmonary arteries well opacified and patent. No  evidence of pulmonary embolism. Mediastinum/Nodes: Esophagus unremarkable. No thoracic adenopathy. Base of cervical region normal appearance. Lungs/Pleura: Dependent atelectasis in lower lobes greater on RIGHT. Minimal atelectasis RIGHT upper lobe. Lungs otherwise clear. No definite infiltrate, pleural effusion, or pneumothorax. Upper Abdomen: Visualized upper abdomen unremarkable. Musculoskeletal: Cystic degenerative changes at LEFT glenoid. No acute osseous findings. Review of the MIP images confirms the above findings. IMPRESSION: No evidence of pulmonary embolism. Scattered atelectasis in the lungs greatest in RIGHT lower lobe. Scattered atherosclerotic calcifications including coronary arteries. Aortic Atherosclerosis (ICD10-I70.0). Electronically Signed: By: MLavonia DanaM.D. On: 10/28/2020 18:57        Scheduled Meds:  amLODipine  5 mg Oral Daily   aspirin  81 mg Oral Daily   azelastine  1 spray Each Nare BID   cholecalciferol  1,000 Units Oral Daily   enoxaparin (LOVENOX) injection  40 mg Subcutaneous Q24H   hydroxychloroquine  200 mg Oral Daily   irbesartan  150 mg Oral Daily   levothyroxine  88 mcg Oral QAC breakfast   mometasone-formoterol  2 puff Inhalation BID   montelukast  10 mg Oral QHS   nitroGLYCERIN  1 inch Topical Q6H   pantoprazole  40 mg Oral Daily   sertraline  25 mg Oral Daily   Continuous Infusions:   LOS: 0 days    Time spent: 42 minutes spent on chart review, discussion with nursing staff, consultants, updating family and interview/physical exam; more than 50% of that time was spent in counseling and/or coordination of care.    Leonela Kivi J [place] DO [facility] Available via Epic secure chat 7am-7pm After these hours, please refer to coverage provider listed on [email or web address] 10/29/2020, 3:01 PM

## 2020-10-29 NOTE — Consult Note (Addendum)
Cardiology Consultation:   Patient ID: Catherine Wang; [id number]; 10/25/1945   Admit date: 10/28/2020 Date of Consult: 10/29/2020  Primary Care Provider: Pcp, No Primary Cardiologist: New to [place]   Patient Profile:   Catherine Wang is a 75 y.o. 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 Alcario Drought.  History of Present Illness:   Ms. Catherine Wang is a 75 year old female with a history stated above who presented to [facility] on 10/28/2020 with complaints of right-sided anterior chest pain and mild SOB. She states that her symptoms initially began on Sunday as a mild fleeting pain however progresses over the course of several days to where the pain was constant and rated as a 10/10 in severity. She states that the pain is worse with deep inspiration and with lying flat. She has point tenderness on exam. The pain is worse with any movement and improves with sitting still. Given the progression of her symptoms, she was seen at a Urgent Care clinic. At that time she was referred to be seen in an ED for further evaluation. Her husband does report vague episodes of diaphoresis which are not associated with chest pain or exertion. She has no prior hx of CAD however her mother and father have had MI and CABG.   In the ED, she was given sublingual nitroglycerin with improvement. EKG with NSR no acute ST/T wave abnormalities. HST negative x2 (5>>6) which seems inconsistent with prolonged severe chest pain therefore there is low suspicion for ACS. CXR with indeterminate rounded density located between the left lateral trachea and aortic arch with recommendations for further evaluation with chest CT. Chest CTA negative for mass or adenopathy to correspond to the described left paratracheal obesity on radiography felt to be due to confluence of vascular shadows. 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  There was no PE however did show scattered coronary calcifications. She was given ASA 325x1 and placed on ASA 81 daily. Creatinine stable at 0.58. CBC stable with no anemia.  Past Medical History:  Diagnosis Date   GERD (gastroesophageal reflux disease)    Hypertension    Mixed connective tissue disease ([place])    History reviewed. No pertinent surgical history.   Prior to Admission medications   Medication Sig Start Date End Date Taking? Authorizing Provider  amLODipine (NORVASC) 5 MG tablet Take 5 mg by mouth daily. 05/18/15  Yes [provider]  ARTIFICIAL TEAR OP Place 1 drop into both eyes daily as needed (For dry eyes).   Yes [provider]  aspirin 81 MG chewable tablet Chew 81 mg by mouth daily.   Yes [provider]  azelastine (ASTELIN) 0.1 % nasal spray Place 1 spray into both nostrils 2 (two) times daily.   Yes [provider]  Cholecalciferol 25 MCG (1000 UT) capsule Take 1,000 Units by mouth daily.   Yes [provider]  Ferrous Sulfate (IRON PO) Take 1 tablet by mouth daily.   Yes [provider]  fluticasone-salmeterol (ADVAIR HFA) 230-21 MCG/ACT inhaler Inhale 2 puffs into the lungs 2 (two) times daily.   Yes [provider]  hydroxychloroquine (PLAQUENIL) 200 MG tablet Take 200-400 mg by mouth See admin instructions. Alternate days. Patient states she takes one tablet by mouth daily and then take two tablets by mouth the next day per  patient. Patient stated she taken two tablets by mouth yesterday(10-27-20) 07/08/15  Yes [provider]  levothyroxine (SYNTHROID) 88 MCG tablet Take 88 mcg by mouth daily before breakfast.   Yes [provider]  montelukast (SINGULAIR) 10 MG tablet Take 10 mg by mouth at bedtime.   Yes [provider]  olmesartan (BENICAR) 20 MG tablet Take 20 mg by mouth daily.   Yes [provider]  Omega-3 Fatty Acids (FISH OIL  PO) Take 1 tablet by mouth daily.   Yes [provider]  pantoprazole (PROTONIX) 40 MG tablet Take 40 mg by mouth daily.   Yes [provider]  sertraline (ZOLOFT) 25 MG tablet Take 25 mg by mouth daily.   Yes [provider]    Inpatient Medications: Scheduled Meds:  amLODipine  5 mg Oral Daily   aspirin  81 mg Oral Daily   azelastine  1 spray Each Nare BID   cholecalciferol  1,000 Units Oral Daily   enoxaparin (LOVENOX) injection  40 mg Subcutaneous Q24H   hydroxychloroquine  200 mg Oral Daily   irbesartan  150 mg Oral Daily   levothyroxine  88 mcg Oral QAC breakfast   mometasone-formoterol  2 puff Inhalation BID   montelukast  10 mg Oral QHS   nitroGLYCERIN  1 inch Topical Q6H   pantoprazole  40 mg Oral Daily   sertraline  25 mg Oral Daily   Continuous Infusions:  PRN Meds: acetaminophen, ondansetron (ZOFRAN) IV  Allergies:   No Known Allergies  Social History:   Social History   Socioeconomic History   Marital status: Married    Spouse name: Not on file   Number of children: Not on file   Years of education: Not on file   Highest education level: Not on file  Occupational History   Not on file  Tobacco Use   Smoking status: Never   Smokeless tobacco: Not on file  Substance and Sexual Activity   Alcohol use: Never   Drug use: Never   Sexual activity: Not on file  Other Topics Concern   Not on file  Social History Narrative   Not on file   Social Determinants of Health   Financial Resource Strain: Not on file  Food Insecurity: Not on file  Transportation Needs: Not on file  Physical Activity: Not on file  Stress: Not on file  Social Connections: Not on file  Intimate Partner Violence: Not on file    Family History:   Family History  Problem Relation Age of Onset   CAD Mother    Family Status:  Family Status  Relation Name Status   Mother  Deceased    ROS:  Please see the history of present illness.  All other ROS  reviewed and negative.     Physical Exam/Data:   Vitals:   10/29/20 0800 10/29/20 1022 10/29/20 1023 10/29/20 1045  BP: (!) 147/89 (!) 126/94 (!) 126/94 122/79  Pulse: 63  88   Resp: 16  16   Temp:      TempSrc:      SpO2: 93%  91%    No intake or output data in the 24 hours ending 10/29/20 1226 There were no vitals filed for this visit. There is no height or weight on file to calculate BMI.   General: Well developed, well nourished, NAD Neck: Negative for carotid bruits. No JVD Lungs:Clear to ausculation bilaterally. No wheezes, rales, or rhonchi. Breathing is unlabored. Cardiovascular: RRR with S1  S2. No murmurs Abdomen: Soft, non-tender, non-distended. No obvious abdominal masses. Extremities: No edema. Radial pulses 2+ bilaterally Neuro: Alert and oriented. No focal deficits. No facial asymmetry. MAE spontaneously. Psych: Responds to questions appropriately with normal affect.     EKG:  The EKG was personally reviewed and demonstrates:  10/29/20 NSR with HR 72bpm, no acte ST/TW changes  Telemetry:  Telemetry was personally reviewed and demonstrates:  10/29/20 NSR with HR 80;s   Relevant CV Studies:  No cardiac studies   Laboratory Data:  Chemistry Recent Labs  Lab 10/28/20 1416  NA 137  K 4.3  CL 104  CO2 26  GLUCOSE 93  BUN 10  CREATININE 0.58  CALCIUM 9.3  GFRNONAA >60  ANIONGAP 7    Total Protein  Date Value Ref Range Status  10/28/2020 7.0 6.5 - 8.1 g/dL Final   Albumin  Date Value Ref Range Status  10/28/2020 3.7 3.5 - 5.0 g/dL Final   AST  Date Value Ref Range Status  10/28/2020 27 15 - 41 U/L Final   ALT  Date Value Ref Range Status  10/28/2020 33 0 - 44 U/L Final   Alkaline Phosphatase  Date Value Ref Range Status  10/28/2020 82 38 - 126 U/L Final   Total Bilirubin  Date Value Ref Range Status  10/28/2020 1.1 0.3 - 1.2 mg/dL Final   Hematology Recent Labs  Lab 10/28/20 1416  WBC 8.8  RBC 4.36  HGB 12.6  HCT 38.9  MCV 89.2   MCH 28.9  MCHC 32.4  RDW 14.6  PLT 241   Cardiac EnzymesNo results for input(s): TROPONINI in the last 168 hours. No results for input(s): TROPIPOC in the last 168 hours.  BNPNo results for input(s): BNP, PROBNP in the last 168 hours.  DDimer No results for input(s): DDIMER in the last 168 hours. TSH: No results found for: TSH Lipids:No results found for: CHOL, HDL, LDLCALC, LDLDIRECT, TRIG, CHOLHDL HgbA1c:No results found for: HGBA1C  Radiology/Studies:  DG Chest 2 View  Result Date: 10/28/2020 CLINICAL DATA:  Chest pain EXAM: CHEST - 2 VIEW COMPARISON:  Chest CT dated December 09, 2002 FINDINGS: Heart is upper limits of normal in size. Indeterminate rounded density located between the left lateral trachea and aortic arch. Bibasilar atelectasis. No focal consolidation. Blunting of the bilateral costophrenic angles possibly due to trace pleural effusions versus pleural thickening. IMPRESSION: Indeterminate rounded density located between the left lateral trachea and aortic arch. Recommend further evaluation with chest CT. Electronically Signed   By: Yetta Glassman M.D.   On: 10/28/2020 15:11   CT Angio Chest PE W and/or Wo Contrast  Addendum Date: 10/28/2020   ADDENDUM REPORT: 10/28/2020 19:35 ADDENDUM: Request made to specifically address the abnormal finding on radiography. There is no mass or adenopathy to correspond to the described left paratracheal opacity on radiography. Suspect that the finding was due to confluence of vascular shadows. 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 Electronically Signed   By: Donavan Foil M.D.   On: 10/28/2020 19:35   Result Date: 10/28/2020 CLINICAL DATA:  RIGHT-side chest pain since Sunday, some pain goes to back, no history of blood clots. History hypertension, asthma EXAM: CT ANGIOGRAPHY CHEST WITH CONTRAST TECHNIQUE: Multidetector CT imaging of the chest was performed using the standard protocol during bolus administration of intravenous  contrast. Multiplanar CT image reconstructions and MIPs were obtained to evaluate the vascular anatomy. CONTRAST:  40m OMNIPAQUE IOHEXOL 350 MG/ML SOLN IV COMPARISON:  CT chest  01/09/2003 FINDINGS: Cardiovascular: Atherosclerotic calcifications aorta, coronary arteries, proximal great vessels. And normal caliber without aneurysm or dissection. Heart unremarkable. No pericardial effusion. Pulmonary arteries well opacified and patent. No evidence of pulmonary embolism. Mediastinum/Nodes: Esophagus unremarkable. No thoracic adenopathy. Base of cervical region normal appearance. Lungs/Pleura: Dependent atelectasis in lower lobes greater on RIGHT. Minimal atelectasis RIGHT upper lobe. Lungs otherwise clear. No definite infiltrate, pleural effusion, or pneumothorax. Upper Abdomen: Visualized upper abdomen unremarkable. Musculoskeletal: Cystic degenerative changes at LEFT glenoid. No acute osseous findings. Review of the MIP images confirms the above findings. IMPRESSION: No evidence of pulmonary embolism. Scattered atelectasis in the lungs greatest in RIGHT lower lobe. Scattered atherosclerotic calcifications including coronary arteries. Aortic Atherosclerosis (ICD10-I70.0). Electronically Signed: By: Lavonia Dana M.D. On: 10/28/2020 18:57    Assessment and Plan:   1.  Atypical chest pain: -Pt presented to [facility] on 10/28/2020 with complaints of right-sided anterior chest pain and mild SOB. Her symptoms initially began on Sunday as a mild fleeting pain however progresses over the course of several days to where the pain was constant and rated as a 10/10 in severity. She states that the pain is worse with deep inspiration and with lying flat. There is point tenderness on exam. The pain is worse with any movement and improves with sitting still. In the ED, she was given sublingual nitroglycerin with improvement.  -EKG with NSR no acute ST/T wave abnormalities.  -HST negative x2 (5>>6) which seems inconsistent with  prolonged severe chest pain therefore there is low suspicion for ACS. -CXR with indeterminate rounded density located between the left lateral trachea and aortic arch with recommendations for further evaluation with chest CT. -Chest CTA negative for mass or adenopathy to correspond to the described left paratracheal obesity on radiography felt to be due to confluence of vascular shadows. Patient noted to have a bovine arch variant?  There was no PE however did show scattered coronary calcifications. She was given ASA 325x1 and placed on ASA 81 daily. Creatinine stable at 0.58.  -Given pleuritic, positional and reproducible chest pain, there is low suspicion for coronary etiology. Will obtain ESR, CRP and an echocardiogram. If negative, will consider NM stress test versus CCTA.   2.  HTN: -Stable, 122/79, 126/94, 147/89 -On PTA amlodipine 5, olmesartan>> -Continue amlodipine 5 mg, started on irbesartan 150 mg   3.  Mixed connective tissue disease: -Per primary team, treated with hydrochloroquine  4. Hypothyroid: -Obtain TSH -Continue synthroid at current dose for now   For questions or updates, please contact [facility] Please consult [email or web address] for contact info under Cardiology/STEMI.   Lyndel Safe NP-C [facility] Pager: [phone] 10/29/2020 12:26 PM   History and all data above reviewed.  Patient examined.  I agree with the findings as above.  The patient no past cardiac history.  She presents with chest discomfort.  This really started Sunday.  Its been constant been building since then.  She describes sharp 10 out of 10 pain.  Some of it was positional and with deep inspiration or if she coughed it would hurt.  She has a little tenderness palpation on her right chest.  It is right-sided.  There is no jaw or arm discomfort.  There is been some mild nausea.  He had some diarrhea today.  She has not had any fevers chills or cough.  She has not had any real shortness of  breath.  Her husband thought she had some diaphoresis.   Her discomfort is slowly ease.  She has been treated with nitroglycerin paste.  Pulmonary emboli was ruled out.  The patient exam reveals COR: Regular rate and rhythm, no rubs,  Lungs: Clear to auscultation bilaterally,  Abd: Positive bowel sounds normal in frequency and pitch, no bruits, rebound, guarding, Ext 2+ pulses, no edema..  All available labs, radiology testing, previous records reviewed. Agree with documented assessment and plan.  Chest pain: This has nonanginal greater than anginal qualities.  So far there is no objective evidence of ischemia.  There are some pleuritic component and I agree with inflammatory markers and echocardiogram.   Provided if these are unremarkable we could proceed with stress testing prior to discharge.    Jeneen Rinks Mayreli Alden  3:26 PM  10/29/2020

## 2020-10-29 NOTE — ED Notes (Signed)
The pt s c/o sl chest pain but she reports that it is much better than when she arrived

## 2020-10-30 ENCOUNTER — Other Ambulatory Visit: Payer: Self-pay | Admitting: Home Health

## 2020-10-30 ENCOUNTER — Observation Stay (HOSPITAL_BASED_OUTPATIENT_CLINIC_OR_DEPARTMENT_OTHER): Payer: Medicare Other

## 2020-10-30 DIAGNOSIS — R0789 Other chest pain: Secondary | ICD-10-CM | POA: Diagnosis not present

## 2020-10-30 DIAGNOSIS — R079 Chest pain, unspecified: Secondary | ICD-10-CM

## 2020-10-30 LAB — ECHOCARDIOGRAM COMPLETE
AR max vel: 2.38 cm2
AV Area VTI: 2.76 cm2
AV Area mean vel: 2.4 cm2
AV Mean grad: 4 mmHg
AV Peak grad: 9.4 mmHg
Ao pk vel: 1.53 m/s
Area-P 1/2: 2.75 cm2
MV VTI: 2.13 cm2
S' Lateral: 2.3 cm
Weight: 3308.8 oz

## 2020-10-30 NOTE — Addendum Note (Signed)
Addended by: Lindell Spar on: 10/30/2020 04:53 PM   Modules accepted: Orders

## 2020-10-30 NOTE — Progress Notes (Signed)
NM stress ordered for outpatient per MD, follow with Dr Antoine Poche.

## 2020-10-30 NOTE — Plan of Care (Signed)
Echo reviewed. No NM stress test available tomorrow due to coverage issue. Discussed with Dr. Antoine Poche, OK to arrange outpatient stress test and no need for inpatient hospitalization for NM stress at this time. Emailed [facility] office triage for arranging NM stress test. Office follow up arranged on 11/16/20 with cardiology.

## 2020-10-30 NOTE — Progress Notes (Signed)
PROGRESS NOTE    Catherine Wang  JKK:[id number] DOB: 04-04-1945 DOA: 10/28/2020 PCP: Merryl Hacker, No    Brief Narrative:  Catherine Wang is a 75 year old female with past medical history significant for mixed connective tissue disease (Lupus Type), essential hypertension, GERD who presented to [facility] ED on 8/21 with right-sided anterior chest pain with mild associated shortness of breath.  Patient states symptoms initially started on Saturday, no specific activity involved.  Pain is localized under her right breast and worse with deep inspiration.  Pain is exacerbated also with movements and relieved at rest.  Patient received sublingual nitroglycerin with reported improvement of her chest discomfort.  No personal history of CAD, but mother/father with MI/CABG in the past.  In the ED, temperature 98.1 F, HR 80, RR 18, BP 157/90, SPO2 96% on room air.  Sodium 137, potassium 4.3, chloride 104, CO2 26, BUN 10, creatinine 0.58, glucose 93.  WBC 8.8, hemoglobin 12.6, platelets 241.  High sensitive troponin 5> 6.  COVID-19 PCR negative.  Influenza A/B PCR negative.  Chest x-ray with indeterminate rounded density located between left lateral trachea and aortic arch; otherwise no focal consolidation.  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  Cardiology was consulted.  Duration consulted for admission for further evaluation and management of chest pain.   Assessment & Plan:   Principal Problem:   Chest pain, rule out acute myocardial infarction Active Problems:   HTN (hypertension)   Mixed connective tissue disease ([place])   Atypical chest pain Patient presenting to the ED with intermittent chest pain, worse with  rest with associated shortness of breath.  Localized to the right chest area without radiation.  Improved with sublingual nitroglycerin received in the ED.  EKG with NSR, no concerning dynamic changes.  Patient is afebrile without leukocytosis.  Patient's pain is reproducible on palpation.  High sensitive troponins negative x2.  CTA chest negative for PE but with scattered atherosclerotic calcifications and aortic atherosclerosis.  Patient with family history of CAD/MI/CABG.  Suspect etiology likely musculoskeletal versus ACS versus inflammatory.  CRP 2.1, ESR 18.  TSH 1.183.  HDL 58, LDL 84. --Cardiology following, appreciate assistance --TTE: Pending --Aspirin 81 mg p.o. daily --Continue monitor on telemetry --Per cardiology, plan NM stress test today --If TTE and stress test negative, cardiology does not plan any further ischemic work-up at this time  Essential hypertension --Amlodipine 5 mg p.o. daily --Irbesartan 150 mg p.o. daily  Mixed connective tissue disease --Continue hydroxychloroquine  GERD: Protonix 40 mg p.o. daily  Depression/anxiety: Zoloft 25 mg p.o. daily  Aortic atherosclerosis Noted on imaging.  HDL 58, LDL 84.  Hx Asthma Follows with pulmonology outpatient, Dr. Ronnald Ramp. --Dulera substituted for home Advair --Singulair  Hypothyroidism TSH 1.183, within normal limits. --Levothyroxine 88 mcg p.o. daily     DVT prophylaxis: enoxaparin (LOVENOX) injection 40 mg Start: 10/28/20 2015   Code Status: Full Code Family Communication: Updated patient spouse was present at bedside this morning  Disposition Plan:  Level of care: Telemetry Cardiac Status is: Observation  The patient remains OBS appropriate and will d/c before 2 midnights.  Dispo: The patient is from: Home              Anticipated d/c is to: Home  Patient currently is not medically stable to d/c.   Difficult to place patient No   Consultants:  Cardiology  Procedures:   None  Antimicrobials:  None   Subjective: Patient seen examined bedside, resting comfortably.  Lying in bed.  Spouse present at bedside.  RN present.  Just had echocardiogram performed awaiting results.  States "feels about the same".  Seen by cardiology this morning with planned nuclear medicine stress test.  Denies headache, no fever/chills/night sweats, no nausea/vomiting/diarrhea, no palpitations, no current shortness of breath, no abdominal pain, no weakness, no fatigue, no paresthesias.  No acute events overnight per nursing staff.  Objective: Vitals:   10/30/20 0001 10/30/20 0418 10/30/20 0751 10/30/20 0857  BP: (!) 143/67 128/78 (!) 137/55   Pulse: 79 80 78 74  Resp: 20 20 18 18   Temp: 98.5 F (36.9 C) 98.2 F (36.8 C) 98.3 F (36.8 C)   TempSrc: Oral Oral Oral   SpO2: 95% 97% 96% 96%  Weight:  93.8 kg      Intake/Output Summary (Last 24 hours) at 10/30/2020 1237 Last data filed at 10/30/2020 0618 Gross per 24 hour  Intake 840 ml  Output 1550 ml  Net -710 ml   Filed Weights   10/30/20 0418  Weight: 93.8 kg    Examination:  General exam: Appears calm and comfortable  Respiratory system: Clear to auscultation. Respiratory effort normal.  On room air Cardiovascular system: S1 & S2 heard, RRR. No JVD, murmurs, rubs, gallops or clicks. No pedal edema.  Mild tenderness to palpation right chest wall which she reports is similar type of pain Gastrointestinal system: Abdomen is nondistended, soft and nontender. No organomegaly or masses felt. Normal bowel sounds heard. Central nervous system: Alert and oriented. No focal neurological deficits. Extremities: Symmetric 5 x 5 power. Skin: No rashes, lesions or ulcers Psychiatry: Judgement and insight appear normal. Mood & affect appropriate.     Data Reviewed: I have personally reviewed following labs and imaging studies  CBC: Recent Labs  Lab 10/28/20 1416  WBC 8.8  NEUTROABS 6.0  HGB 12.6  HCT 38.9  MCV 89.2   PLT 716   Basic Metabolic Panel: Recent Labs  Lab 10/28/20 1416  NA 137  K 4.3  CL 104  CO2 26  GLUCOSE 93  BUN 10  CREATININE 0.58  CALCIUM 9.3   GFR: CrCl cannot be calculated (Unknown ideal weight.). Liver Function Tests: Recent Labs  Lab 10/28/20 1416  AST 27  ALT 33  ALKPHOS 82  BILITOT 1.1  PROT 7.0  ALBUMIN 3.7   No results for input(s): LIPASE, AMYLASE in the last 168 hours. No results for input(s): AMMONIA in the last 168 hours. Coagulation Profile: No results for input(s): INR, PROTIME in the last 168 hours. Cardiac Enzymes: No results for input(s): CKTOTAL, CKMB, CKMBINDEX, TROPONINI in the last 168 hours. BNP (last 3 results) No results for input(s): PROBNP in the last 8760 hours. HbA1C: No results for input(s): HGBA1C in the last 72 hours. CBG: No results for input(s): GLUCAP in the last 168 hours. Lipid Profile: Recent Labs    10/29/20 1522  CHOL 172  HDL 58  LDLCALC 84  TRIG 152*  CHOLHDL 3.0   Thyroid Function Tests: Recent Labs    10/29/20 1511  TSH 1.183   Anemia Panel: No results for input(s): VITAMINB12, FOLATE, FERRITIN, TIBC, IRON, RETICCTPCT in the last 72 hours. Sepsis Labs: No results for input(s): PROCALCITON, LATICACIDVEN in the last 168 hours.  Recent Results (  from the past 240 hour(s))  Resp Panel by RT-PCR (Flu A&B, Covid) Nasopharyngeal Swab     Status: None   Collection Time: 10/28/20  8:06 PM   Specimen: Nasopharyngeal Swab; Nasopharyngeal(NP) swabs in vial transport medium  Result Value Ref Range Status   SARS Coronavirus 2 by RT PCR NEGATIVE NEGATIVE Final    Comment: (NOTE) SARS-CoV-2 target nucleic acids are NOT DETECTED.  The SARS-CoV-2 RNA is generally detectable in upper respiratory specimens during the acute phase of infection. The lowest concentration of SARS-CoV-2 viral copies this assay can detect is 138 copies/mL. A negative result does not preclude SARS-Cov-2 infection and should not be used as  the sole basis for treatment or other patient management decisions. A negative result may occur with  improper specimen collection/handling, submission of specimen other than nasopharyngeal swab, presence of viral mutation(s) within the areas targeted by this assay, and inadequate number of viral copies(<138 copies/mL). A negative result must be combined with clinical observations, patient history, and epidemiological information. The expected result is Negative.  Fact Sheet for Patients:  EntrepreneurPulse.com.au  Fact Sheet for Healthcare Providers:  IncredibleEmployment.be  This test is no t yet approved or cleared by the [place] FDA and  has been authorized for detection and/or diagnosis of SARS-CoV-2 by FDA under an Emergency Use Authorization (EUA). This EUA will remain  in effect (meaning this test can be used) for the duration of the COVID-19 declaration under Section 564(b)(1) of the Act, 21 U.S.C.section 360bbb-3(b)(1), unless the authorization is terminated  or revoked sooner.       Influenza A by PCR NEGATIVE NEGATIVE Final   Influenza B by PCR NEGATIVE NEGATIVE Final    Comment: (NOTE) The Xpert Xpress SARS-CoV-2/FLU/RSV plus assay is intended as an aid in the diagnosis of influenza from Nasopharyngeal swab specimens and should not be used as a sole basis for treatment. Nasal washings and aspirates are unacceptable for Xpert Xpress SARS-CoV-2/FLU/RSV testing.  Fact Sheet for Patients: EntrepreneurPulse.com.au  Fact Sheet for Healthcare Providers: IncredibleEmployment.be  This test is not yet approved or cleared by the [place] FDA and has been authorized for detection and/or diagnosis of SARS-CoV-2 by FDA under an Emergency Use Authorization (EUA). This EUA will remain in effect (meaning this test can be used) for the duration of the COVID-19 declaration under Section 564(b)(1) of  the Act, 21 U.S.C. section 360bbb-3(b)(1), unless the authorization is terminated or revoked.  Performed at [facility], [place] [address], [place], [place] [address]          Radiology Studies: DG Chest 2 View  Result Date: 10/28/2020 CLINICAL DATA:  Chest pain EXAM: CHEST - 2 VIEW COMPARISON:  Chest CT dated December 09, 2002 FINDINGS: Heart is upper limits of normal in size. Indeterminate rounded density located between the left lateral trachea and aortic arch. Bibasilar atelectasis. No focal consolidation. Blunting of the bilateral costophrenic angles possibly due to trace pleural effusions versus pleural thickening. IMPRESSION: Indeterminate rounded density located between the left lateral trachea and aortic arch. Recommend further evaluation with chest CT. Electronically Signed   By: Yetta Glassman M.D.   On: 10/28/2020 15:11   CT Angio Chest PE W and/or Wo Contrast  Addendum Date: 10/28/2020   ADDENDUM REPORT: 10/28/2020 19:35 ADDENDUM: Request made to specifically address the abnormal finding on radiography. There is no mass or adenopathy to correspond to the described left paratracheal opacity on radiography. Suspect that the finding was due to confluence of vascular shadows.  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 Electronically Signed   By: Donavan Foil M.D.   On: 10/28/2020 19:35   Result Date: 10/28/2020 CLINICAL DATA:  RIGHT-side chest pain since Sunday, some pain goes to back, no history of blood clots. History hypertension, asthma EXAM: CT ANGIOGRAPHY CHEST WITH CONTRAST TECHNIQUE: Multidetector CT imaging of the chest was performed using the standard protocol during bolus administration of intravenous contrast. Multiplanar CT image reconstructions and MIPs were obtained to evaluate the vascular anatomy. CONTRAST:  25m OMNIPAQUE IOHEXOL 350 MG/ML SOLN IV COMPARISON:  CT chest 01/09/2003 FINDINGS: Cardiovascular: Atherosclerotic calcifications aorta, coronary  arteries, proximal great vessels. And normal caliber without aneurysm or dissection. Heart unremarkable. No pericardial effusion. Pulmonary arteries well opacified and patent. No evidence of pulmonary embolism. Mediastinum/Nodes: Esophagus unremarkable. No thoracic adenopathy. Base of cervical region normal appearance. Lungs/Pleura: Dependent atelectasis in lower lobes greater on RIGHT. Minimal atelectasis RIGHT upper lobe. Lungs otherwise clear. No definite infiltrate, pleural effusion, or pneumothorax. Upper Abdomen: Visualized upper abdomen unremarkable. Musculoskeletal: Cystic degenerative changes at LEFT glenoid. No acute osseous findings. Review of the MIP images confirms the above findings. IMPRESSION: No evidence of pulmonary embolism. Scattered atelectasis in the lungs greatest in RIGHT lower lobe. Scattered atherosclerotic calcifications including coronary arteries. Aortic Atherosclerosis (ICD10-I70.0). Electronically Signed: By: MLavonia DanaM.D. On: 10/28/2020 18:57        Scheduled Meds:  amLODipine  5 mg Oral Daily   aspirin  81 mg Oral Daily   azelastine  1 spray Each Nare BID   cholecalciferol  1,000 Units Oral Daily   enoxaparin (LOVENOX) injection  40 mg Subcutaneous Q24H   hydroxychloroquine  200 mg Oral Daily   irbesartan  150 mg Oral Daily   levothyroxine  88 mcg Oral QAC breakfast   mometasone-formoterol  2 puff Inhalation BID   montelukast  10 mg Oral QHS   nitroGLYCERIN  1 inch Topical Q6H   pantoprazole  40 mg Oral Daily   sertraline  25 mg Oral Daily   Continuous Infusions:   LOS: 0 days    Time spent: 39 minutes spent on chart review, discussion with nursing staff, consultants, updating family and interview/physical exam; more than 50% of that time was spent in counseling and/or coordination of care.    Chinara Hertzberg J [place] DO [facility] Available via Epic secure chat 7am-7pm After these hours, please refer to coverage provider listed on  [email or web address] 10/30/2020, 12:37 PM

## 2020-10-30 NOTE — Discharge Summary (Signed)
Physician Discharge Summary  Catherine Wang KDX:[id number] DOB: 1946-01-12 DOA: 10/28/2020  PCP: Pcp, No  Admit date: 10/28/2020 Discharge date: 10/30/2020  Admitted From: Home Disposition: Home  Recommendations for Outpatient Follow-up:  Follow up with PCP in 1-2 weeks Outpatient follow-up with cardiology on 11/16/2020 at 10:15 AM at [facility] clinic for planned outpatient stress test.  Home Health: No Equipment/Devices: None  Discharge Condition: Stable CODE STATUS: Full code Diet recommendation: Heart healthy diet  History of present illness:  Catherine Wang is a 75 year old female with past medical history significant for mixed connective tissue disease (Lupus Type), essential hypertension, GERD who presented to [facility] ED on 8/21 with right-sided anterior chest pain with mild associated shortness of breath.  Patient states symptoms initially started on Saturday, no specific activity involved.  Pain is localized under her right breast and worse with deep inspiration.  Pain is exacerbated also with movements and relieved at rest.  Patient received sublingual nitroglycerin with reported improvement of her chest discomfort.  No personal history of CAD, but mother/father with MI/CABG in the past.   In the ED, temperature 98.1 F, HR 80, RR 18, BP 157/90, SPO2 96% on room air.  Sodium 137, potassium 4.3, chloride 104, CO2 26, BUN 10, creatinine 0.58, glucose 93.  WBC 8.8, hemoglobin 12.6, platelets 241.  High sensitive troponin 5> 6.  COVID-19 PCR negative.  Influenza A/B PCR negative.  Chest x-ray with indeterminate rounded density located between left lateral trachea and aortic arch; otherwise no focal consolidation.  CTA chest negative for pulmonary embolism, scattered atelectasis in the lungs greatest right lower lobe with scattered atherosclerotic calcifications including coronary arteries, aortic atherosclerosis and no mass or adenopathy to correspond to the described left peritracheal opacity  on chest x-ray, also noted to have a bovine arch variant.  Cardiology was consulted.  Duration consulted for admission for further evaluation and management of chest pain.  Hospital course:  Atypical chest pain Patient presenting to the ED with intermittent chest pain, worse with rest with associated shortness of breath.  Localized to the right chest area without radiation.  Improved with sublingual nitroglycerin received in the ED.  EKG with NSR, no concerning dynamic changes.  Patient is afebrile without leukocytosis.  Patient's pain is reproducible on palpation.  High sensitive troponins negative x2.  CTA chest negative for PE but with scattered atherosclerotic calcifications and aortic atherosclerosis.  Patient with family history of CAD/MI/CABG.  Suspect etiology likely musculoskeletal versus ACS versus inflammatory.  CRP 2.1, ESR 18.  TSH 1.183.  HDL 58, LDL 84.Cardiology was consulted and followed during hospital course.  Patient underwent TTE with LVEF 97-67%, grade 1 diastolic dysfunction, no LV regional wall motion abnormalities.  Unable to perform nuclear medicine stress test currently inpatient and cardiology cleared for discharge home with outpatient follow-up scheduled on 11/16/2020 at 10:15 AM for outpatient stress test.  Continue aspirin 81 mg p.o. daily.   Essential hypertension Amlodipine 5 mg p.o. daily and olmesartan 20 mg p.o. daily   Mixed connective tissue disease: Continue hydroxychloroquine   GERD: Protonix 40 mg p.o. daily   Depression/anxiety: Zoloft 25 mg p.o. daily   Aortic atherosclerosis Noted on imaging.  HDL 58, LDL 84.   Hx Asthma Follows with pulmonology outpatient, Dr. Ronnald Ramp.  Continue home Advair, Singulair.   Hypothyroidism TSH 1.183, within normal limits.  Continue levothyroxine 88 mcg p.o. daily  Discharge Diagnoses:  Principal Problem:   Chest pain, rule out acute myocardial infarction Active Problems:   HTN (  hypertension)   Mixed connective  tissue disease [facility])    Discharge Instructions  Discharge Instructions     Call MD for:  difficulty breathing, headache or visual disturbances   Complete by: As directed    Call MD for:  extreme fatigue   Complete by: As directed    Call MD for:  persistant dizziness or light-headedness   Complete by: As directed    Call MD for:  persistant nausea and vomiting   Complete by: As directed    Call MD for:  severe uncontrolled pain   Complete by: As directed    Call MD for:  temperature >100.4   Complete by: As directed    Diet - low sodium heart healthy   Complete by: As directed    Increase activity slowly   Complete by: As directed       Allergies as of 10/30/2020   No Known Allergies      Medication List     TAKE these medications    amLODipine 5 MG tablet Commonly known as: NORVASC Take 5 mg by mouth daily.   ARTIFICIAL TEAR OP Place 1 drop into both eyes daily as needed (For dry eyes).   aspirin 81 MG chewable tablet Chew 81 mg by mouth daily.   azelastine 0.1 % nasal spray Commonly known as: ASTELIN Place 1 spray into both nostrils 2 (two) times daily.   Cholecalciferol 25 MCG (1000 UT) capsule Take 1,000 Units by mouth daily.   FISH OIL PO Take 1 tablet by mouth daily.   fluticasone-salmeterol 230-21 MCG/ACT inhaler Commonly known as: ADVAIR HFA Inhale 2 puffs into the lungs 2 (two) times daily.   hydroxychloroquine 200 MG tablet Commonly known as: PLAQUENIL Take 200-400 mg by mouth See admin instructions. Alternate days. Patient states she takes one tablet by mouth daily and then take two tablets by mouth the next day per patient. Patient stated she taken two tablets by mouth yesterday(10-27-20)   IRON PO Take 1 tablet by mouth daily.   levothyroxine 88 MCG tablet Commonly known as: SYNTHROID Take 88 mcg by mouth daily before breakfast.   montelukast 10 MG tablet Commonly known as: SINGULAIR Take 10 mg by mouth at bedtime.   olmesartan 20  MG tablet Commonly known as: BENICAR Take 20 mg by mouth daily.   pantoprazole 40 MG tablet Commonly known as: PROTONIX Take 40 mg by mouth daily.   sertraline 25 MG tablet Commonly known as: ZOLOFT Take 25 mg by mouth daily.        Follow-up Information     Almyra Deforest, [place]. Go on 11/16/2020.   Specialties: Cardiology, Radiology Why: 10:15am Contact information: [address] [place] [address] [phone]                No Known Allergies  Consultations: Cardiology, Dr. Percival Spanish   Procedures/Studies: DG Chest 2 View  Result Date: 10/28/2020 CLINICAL DATA:  Chest pain EXAM: CHEST - 2 VIEW COMPARISON:  Chest CT dated December 09, 2002 FINDINGS: Heart is upper limits of normal in size. Indeterminate rounded density located between the left lateral trachea and aortic arch. Bibasilar atelectasis. No focal consolidation. Blunting of the bilateral costophrenic angles possibly due to trace pleural effusions versus pleural thickening. IMPRESSION: Indeterminate rounded density located between the left lateral trachea and aortic arch. Recommend further evaluation with chest CT. Electronically Signed   By: Yetta Glassman M.D.   On: 10/28/2020 15:11   CT Angio Chest PE  W and/or Wo Contrast  Addendum Date: 10/28/2020   ADDENDUM REPORT: 10/28/2020 19:35 ADDENDUM: Request made to specifically address the abnormal finding on radiography. There is no mass or adenopathy to correspond to the described left paratracheal opacity on radiography. Suspect that the finding was due to confluence of vascular shadows. 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 Electronically Signed   By: Donavan Foil M.D.   On: 10/28/2020 19:35   Result Date: 10/28/2020 CLINICAL DATA:  RIGHT-side chest pain since _0  bpm. Exam Location:  Inpatient Procedure: 2D Echo, Cardiac Doppler and Color Doppler Indications:    Chest pain  History:        Patient has no prior history of Echocardiogram examinations.                  Signs/Symptoms:Shortness of Breath; Risk Factors:Hypertension.                 GERD.  Sonographer:    Clayton Lefort RDCS (AE) Referring Phys: [place]  1. Left ventricular ejection fraction, by estimation, is 60 to 65%. The left ventricle has normal function. The left ventricle has no regional wall motion abnormalities. Left ventricular diastolic parameters are consistent with Grade I diastolic dysfunction (impaired relaxation).  2. Right ventricular systolic function is normal. The right ventricular size is normal.  3. The mitral valve is normal in structure. No evidence of mitral valve regurgitation. No evidence of mitral stenosis.  4. The aortic valve is tricuspid.  Aortic valve regurgitation is not visualized. Aortic valve sclerosis is present, with no evidence of aortic valve stenosis.  5. The inferior vena cava is normal in size with greater than 50% respiratory variability, suggesting right atrial pressure of 3 mmHg. Comparison(s): No prior Echocardiogram. FINDINGS  Left Ventricle: Left ventricular ejection fraction, by estimation, is 60 to 65%. The left ventricle has normal function. The left ventricle has no regional wall motion abnormalities. The left ventricular internal cavity size was small. There is no left ventricular hypertrophy. Left ventricular diastolic parameters are consistent with Grade I diastolic dysfunction (impaired relaxation). Right Ventricle: The right ventricular size is normal. No increase in right ventricular wall thickness. Right ventricular systolic function is normal. Left Atrium: Left atrial size was normal in size. Right Atrium: Right atrial size was normal in size. Pericardium: There is no evidence of pericardial effusion. Mitral Valve: The mitral valve is normal in structure. No evidence of mitral valve regurgitation. No evidence of mitral valve stenosis. MV peak gradient, 5.8 mmHg. The mean mitral valve gradient is 3.0 mmHg. Tricuspid Valve: The  tricuspid valve is normal in structure. Tricuspid valve regurgitation is not demonstrated. No evidence of tricuspid stenosis. Aortic Valve: The aortic valve is tricuspid. Aortic valve regurgitation is not visualized. Mild aortic valve sclerosis is present, with no evidence of aortic valve stenosis. Aortic valve mean gradient measures 4.0 mmHg. Aortic valve peak gradient measures 9.4 mmHg. Aortic valve area, by VTI measures 2.76 cm. Pulmonic Valve: The pulmonic valve was grossly normal. Pulmonic valve regurgitation is trivial. No evidence of pulmonic stenosis. Aorta: The aortic root and ascending aorta are structurally normal, with no evidence of dilitation. Venous: The inferior vena cava is normal in size with greater than 50% respiratory variability, suggesting right atrial pressure of 3 mmHg. IAS/Shunts: The atrial septum is grossly normal.  LEFT VENTRICLE PLAX 2D LVIDd:         3.60 cm  Diastology LVIDs:         2.30 cm  LV e' medial:    7.34 cm/s LV PW:         1.00 cm  LV E/e' medial:  13.8 LV IVS:        1.40 cm  LV e' lateral:   10.30 cm/s LVOT diam:     2.00 cm  LV E/e' lateral: 9.8 LV SV:         79 LV SV Index:   40 LVOT Area:     3.14 cm  RIGHT VENTRICLE             IVC RV Basal diam:  2.90 cm     IVC diam: 1.10 cm RV S prime:     12.60 cm/s TAPSE (M-mode): 1.7 cm LEFT ATRIUM             Index       RIGHT ATRIUM          Index LA diam:        2.30 cm 1.17 cm/m  RA Area:     8.48 cm LA Vol (A2C):   63.6 ml 32.42 ml/m RA Volume:   14.60 ml 7.44 ml/m LA Vol (A4C):   50.4 ml 25.69 ml/m LA Biplane Vol: 53.2 ml 27.12 ml/m  AORTIC VALVE AV Area (Vmax):    2.38 cm AV Area (Vmean):   2.40 cm AV Area (VTI):     2.76 cm AV Vmax:           153.00 cm/s AV Vmean:  93.200 cm/s AV VTI:            0.287 m AV Peak Grad:      9.4 mmHg AV Mean Grad:      4.0 mmHg LVOT Vmax:         116.00 cm/s LVOT Vmean:        71.100 cm/s LVOT VTI:          0.252 m LVOT/AV VTI ratio: 0.88  AORTA Ao Asc diam: 3.50 cm  MITRAL VALVE MV Area (PHT): 2.75 cm     SHUNTS MV Area VTI:   2.13 cm     Systemic VTI:  0.25 m MV Peak grad:  5.8 mmHg     Systemic Diam: 2.00 cm MV Mean grad:  3.0 mmHg MV Vmax:       1.20 m/s MV Vmean:      86.4 cm/s MV Decel Time: 276 msec MV E velocity: 101.00 cm/s MV A velocity: 126.00 cm/s MV E/A ratio:  0.80 Rudean Haskell MD Electronically signed by Rudean Haskell MD Signature Date/Time: 10/30/2020/12:44:20 PM    Final      Subjective: Patient seen examined at bedside, resting comfortably.  Spouse present.  Echo completed and unrevealing.  Okay for discharge home with outpatient follow-up scheduled for outpatient stress test per cardiology.  No other questions or concerns at this time.  Denies headache, no current chest pain/palpitations, no shortness of breath, no abdominal pain, no fever/chills/night sweats, no nausea/vomiting/diarrhea, no weakness, no fatigue, no paresthesias.  No acute events overnight per nursing staff.  Discharge Exam: Vitals:   10/30/20 0857 10/30/20 1424  BP:  137/77  Pulse: 74 71  Resp: 18 18  Temp:  98.6 F (37 C)  SpO2: 96% 97%   Vitals:   10/30/20 0418 10/30/20 0751 10/30/20 0857 10/30/20 1424  BP: 128/78 (!) 137/55  137/77  Pulse: 80 78 74 71  Resp: _0 Temp: 98.2 F (36.8 C) 98.3 F (36.8 C)  98.6 F (37 C)  TempSrc: Oral Oral  Oral  SpO2: 97% 96% 96% 97%  Weight: 93.8 kg       General: Pt is alert, awake, not in acute distress Cardiovascular: RRR, S1/S2 +, no rubs, no gallops Respiratory: CTA bilaterally, no wheezing, no rhonchi, on room air Abdominal: Soft, NT, ND, bowel sounds + Extremities: no edema, no cyanosis    The results of significant diagnostics from this hospitalization (including imaging, microbiology, ancillary and laboratory) are listed below for reference.     Microbiology: Recent Results (from the past 240 hour(s))  Resp Panel by RT-PCR (Flu A&B, Covid) Nasopharyngeal Swab     Status: None    Collection Time: 10/28/20  8:06 PM   Specimen: Nasopharyngeal Swab; Nasopharyngeal(NP) swabs in vial transport medium  Result Value Ref Range Status   SARS Coronavirus 2 by RT PCR NEGATIVE NEGATIVE Final    Comment: (NOTE) SARS-CoV-2 target nucleic acids are NOT DETECTED.  The SARS-CoV-2 RNA is generally detectable in upper respiratory specimens during the acute phase of infection. The lowest concentration of SARS-CoV-2 viral copies this assay can detect is 138 copies/mL. A negative result does not preclude SARS-Cov-2 infection and should not be used as the sole basis for treatment or other patient management decisions. A negative result may occur with  improper specimen collection/handling, submission of specimen other than nasopharyngeal swab, presence of viral mutation(s) within the areas targeted by this assay, and inadequate number of viral copies(<138 copies/mL). A negative result  must be combined with clinical observations, patient history, and epidemiological information. The expected result is Negative.  Fact Sheet for Patients:  EntrepreneurPulse.com.au  Fact Sheet for Healthcare Providers:  IncredibleEmployment.be  This test is no t yet approved or cleared by the [place] FDA and  has been authorized for detection and/or diagnosis of SARS-CoV-2 by FDA under an Emergency Use Authorization (EUA). This EUA will remain  in effect (meaning this test can be used) for the duration of the COVID-19 declaration under Section 564(b)(1) of the Act, 21 U.S.C.section 360bbb-3(b)(1), unless the authorization is terminated  or revoked sooner.       Influenza A by PCR NEGATIVE NEGATIVE Final   Influenza B by PCR NEGATIVE NEGATIVE Final    Comment: (NOTE) The Xpert Xpress SARS-CoV-2/FLU/RSV plus assay is intended as an aid in the diagnosis of influenza from Nasopharyngeal swab specimens and should not be used as a sole basis for treatment.  Nasal washings and aspirates are unacceptable for Xpert Xpress SARS-CoV-2/FLU/RSV testing.  Fact Sheet for Patients: EntrepreneurPulse.com.au  Fact Sheet for Healthcare Providers: IncredibleEmployment.be  This test is not yet approved or cleared by the [place] FDA and has been authorized for detection and/or diagnosis of SARS-CoV-2 by FDA under an Emergency Use Authorization (EUA). This EUA will remain in effect (meaning this test can be used) for the duration of the COVID-19 declaration under Section 564(b)(1) of the Act, 21 U.S.C. section 360bbb-3(b)(1), unless the authorization is terminated or revoked.  Performed at [facility], [place] [address], [place], [place] [address]      Labs: BNP (last 3 results) No results for input(s): BNP in the last 8760 hours. Basic Metabolic Panel: Recent Labs  Lab 10/28/20 1416  NA 137  K 4.3  CL 104  CO2 26  GLUCOSE 93  BUN 10  CREATININE 0.58  CALCIUM 9.3   Liver Function Tests: Recent Labs  Lab 10/28/20 1416  AST 27  ALT 33  ALKPHOS 82  BILITOT 1.1  PROT 7.0  ALBUMIN 3.7   No results for input(s): LIPASE, AMYLASE in the last 168 hours. No results for input(s): AMMONIA in the last 168 hours. CBC: Recent Labs  Lab 10/28/20 1416  WBC 8.8  NEUTROABS 6.0  HGB 12.6  HCT 38.9  MCV 89.2  PLT 241   Cardiac Enzymes: No results for input(s): CKTOTAL, CKMB, CKMBINDEX, TROPONINI in the last 168 hours. BNP: Invalid input(s): POCBNP CBG: No results for input(s): GLUCAP in the last 168 hours. D-Dimer No results for input(s): DDIMER in the last 72 hours. Hgb A1c No results for input(s): HGBA1C in the last 72 hours. Lipid Profile Recent Labs    10/29/20 1522  CHOL 172  HDL 58  LDLCALC 84  TRIG 152*  CHOLHDL 3.0   Thyroid function studies Recent Labs    10/29/20 1511  TSH 1.183   Anemia work up No results for input(s): VITAMINB12, FOLATE, FERRITIN, TIBC, IRON,  RETICCTPCT in the last 72 hours. Urinalysis No results found for: COLORURINE, APPEARANCEUR, [place], [place], [place], [place], [place], [place], PROTEINUR, UROBILINOGEN, NITRITE, LEUKOCYTESUR Sepsis Labs Invalid input(s): PROCALCITONIN,  WBC,  LACTICIDVEN Microbiology Recent Results (from the past 240 hour(s))  Resp Panel by RT-PCR (Flu A&B, Covid) Nasopharyngeal Swab     Status: None   Collection Time: 10/28/20  8:06 PM   Specimen: Nasopharyngeal Swab; Nasopharyngeal(NP) swabs in vial transport medium  Result Value Ref Range Status   SARS Coronavirus 2 by RT PCR NEGATIVE NEGATIVE Final  Comment: (NOTE) SARS-CoV-2 target nucleic acids are NOT DETECTED.  The SARS-CoV-2 RNA is generally detectable in upper respiratory specimens during the acute phase of infection. The lowest concentration of SARS-CoV-2 viral copies this assay can detect is 138 copies/mL. A negative result does not preclude SARS-Cov-2 infection and should not be used as the sole basis for treatment or other patient management decisions. A negative result may occur with  improper specimen collection/handling, submission of specimen other than nasopharyngeal swab, presence of viral mutation(s) within the areas targeted by this assay, and inadequate number of viral copies(<138 copies/mL). A negative result must be combined with clinical observations, patient history, and epidemiological information. The expected result is Negative.  Fact Sheet for Patients:  EntrepreneurPulse.com.au  Fact Sheet for Healthcare Providers:  IncredibleEmployment.be  This test is no t yet approved or cleared by the [place] FDA and  has been authorized for detection and/or diagnosis of SARS-CoV-2 by FDA under an Emergency Use Authorization (EUA). This EUA will remain  in effect (meaning this test can be used) for the duration of the COVID-19 declaration under Section 564(b)(1) of the Act,  21 U.S.C.section 360bbb-3(b)(1), unless the authorization is terminated  or revoked sooner.       Influenza A by PCR NEGATIVE NEGATIVE Final   Influenza B by PCR NEGATIVE NEGATIVE Final    Comment: (NOTE) The Xpert Xpress SARS-CoV-2/FLU/RSV plus assay is intended as an aid in the diagnosis of influenza from Nasopharyngeal swab specimens and should not be used as a sole basis for treatment. Nasal washings and aspirates are unacceptable for Xpert Xpress SARS-CoV-2/FLU/RSV testing.  Fact Sheet for Patients: EntrepreneurPulse.com.au  Fact Sheet for Healthcare Providers: IncredibleEmployment.be  This test is not yet approved or cleared by the [place] FDA and has been authorized for detection and/or diagnosis of SARS-CoV-2 by FDA under an Emergency Use Authorization (EUA). This EUA will remain in effect (meaning this test can be used) for the duration of the COVID-19 declaration under Section 564(b)(1) of the Act, 21 U.S.C. section 360bbb-3(b)(1), unless the authorization is terminated or revoked.  Performed at [facility], [place] [address] [place], [place] [address]      Time coordinating discharge: Over 30 minutes  SIGNED:   Breeonna Mone J [place] DO  [facility] 10/30/2020, 4:34 PM

## 2020-10-30 NOTE — Progress Notes (Signed)
Progress Note  Patient Name: Catherine Wang Date of Encounter: 10/30/2020  Primary Cardiologist:   None   Subjective   Still with right sided chest pain.    Inpatient Medications    Scheduled Meds:  amLODipine  5 mg Oral Daily   aspirin  81 mg Oral Daily   azelastine  1 spray Each Nare BID   cholecalciferol  1,000 Units Oral Daily   enoxaparin (LOVENOX) injection  40 mg Subcutaneous Q24H   hydroxychloroquine  200 mg Oral Daily   irbesartan  150 mg Oral Daily   levothyroxine  88 mcg Oral QAC breakfast   mometasone-formoterol  2 puff Inhalation BID   montelukast  10 mg Oral QHS   nitroGLYCERIN  1 inch Topical Q6H   pantoprazole  40 mg Oral Daily   sertraline  25 mg Oral Daily   Continuous Infusions:  PRN Meds: acetaminophen, ondansetron (ZOFRAN) IV   Vital Signs    Vitals:   10/30/20 0001 10/30/20 0418 10/30/20 0751 10/30/20 0857  BP: (!) 143/67 128/78 (!) 137/55   Pulse: 79 80 78 74  Resp: 20 20 18 18   Temp: 98.5 F (36.9 C) 98.2 F (36.8 C) 98.3 F (36.8 C)   TempSrc: Oral Oral Oral   SpO2: 95% 97% 96% 96%  Weight:  93.8 kg      Intake/Output Summary (Last 24 hours) at 10/30/2020 1026 Last data filed at 10/30/2020 0618 Gross per 24 hour  Intake 840 ml  Output 1550 ml  Net -710 ml   Filed Weights   10/30/20 0418  Weight: 93.8 kg    Telemetry    NSR - Personally Reviewed  ECG    NA - Personally Reviewed  Physical Exam   GEN: No acute distress.   Neck: No  JVD Cardiac: RRR, no murmurs, rubs, or gallops.  Respiratory: Clear  to auscultation bilaterally. GI: Soft, nontender, non-distended  MS: No edema; No deformity. Neuro:  Nonfocal  Psych: Normal affect   Labs    Chemistry Recent Labs  Lab 10/28/20 1416  NA 137  K 4.3  CL 104  CO2 26  GLUCOSE 93  BUN 10  CREATININE 0.58  CALCIUM 9.3  PROT 7.0  ALBUMIN 3.7  AST 27  ALT 33  ALKPHOS 82  BILITOT 1.1  GFRNONAA >60  ANIONGAP 7     Hematology Recent Labs  Lab  10/28/20 1416  WBC 8.8  RBC 4.36  HGB 12.6  HCT 38.9  MCV 89.2  MCH 28.9  MCHC 32.4  RDW 14.6  PLT 241    Cardiac EnzymesNo results for input(s): TROPONINI in the last 168 hours. No results for input(s): TROPIPOC in the last 168 hours.   BNPNo results for input(s): BNP, PROBNP in the last 168 hours.   DDimer No results for input(s): DDIMER in the last 168 hours.   Radiology    DG Chest 2 View  Result Date: 10/28/2020 CLINICAL DATA:  Chest pain EXAM: CHEST - 2 VIEW COMPARISON:  Chest CT dated December 09, 2002 FINDINGS: Heart is upper limits of normal in size. Indeterminate rounded density located between the left lateral trachea and aortic arch. Bibasilar atelectasis. No focal consolidation. Blunting of the bilateral costophrenic angles possibly due to trace pleural effusions versus pleural thickening. IMPRESSION: Indeterminate rounded density located between the left lateral trachea and aortic arch. Recommend further evaluation with chest CT. Electronically Signed   By: December 11, 2002 M.D.   On: 10/28/2020 15:11   CT  Angio Chest PE W and/or Wo Contrast  Addendum Date: 10/28/2020   ADDENDUM REPORT: 10/28/2020 19:35 ADDENDUM: Request made to specifically address the abnormal finding on radiography. There is no mass or adenopathy to correspond to the described left paratracheal opacity on radiography. Suspect that the finding was due to confluence of vascular shadows. 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 Electronically Signed   By: Jasmine Pang M.D.   On: 10/28/2020 19:35   Result Date: 10/28/2020 CLINICAL DATA:  RIGHT-side chest pain since Sunday, some pain goes to back, no history of blood clots. History hypertension, asthma EXAM: CT ANGIOGRAPHY CHEST WITH CONTRAST TECHNIQUE: Multidetector CT imaging of the chest was performed using the standard protocol during bolus administration of intravenous contrast. Multiplanar CT image reconstructions and MIPs were obtained to  evaluate the vascular anatomy. CONTRAST:  30mL OMNIPAQUE IOHEXOL 350 MG/ML SOLN IV COMPARISON:  CT chest 01/09/2003 FINDINGS: Cardiovascular: Atherosclerotic calcifications aorta, coronary arteries, proximal great vessels. And normal caliber without aneurysm or dissection. Heart unremarkable. No pericardial effusion. Pulmonary arteries well opacified and patent. No evidence of pulmonary embolism. Mediastinum/Nodes: Esophagus unremarkable. No thoracic adenopathy. Base of cervical region normal appearance. Lungs/Pleura: Dependent atelectasis in lower lobes greater on RIGHT. Minimal atelectasis RIGHT upper lobe. Lungs otherwise clear. No definite infiltrate, pleural effusion, or pneumothorax. Upper Abdomen: Visualized upper abdomen unremarkable. Musculoskeletal: Cystic degenerative changes at LEFT glenoid. No acute osseous findings. Review of the MIP images confirms the above findings. IMPRESSION: No evidence of pulmonary embolism. Scattered atelectasis in the lungs greatest in RIGHT lower lobe. Scattered atherosclerotic calcifications including coronary arteries. Aortic Atherosclerosis (ICD10-I70.0). Electronically Signed: By: Ulyses Southward M.D. On: 10/28/2020 18:57    Cardiac Studies   ECHO and Lexiscan Myoview pending  Patient Profile   75 y.o. 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 Julian Reil.  Assessment & Plan    Atypical chest pain:     Negative enzymes.  Negative inflammatory markers.   Echo is pending.  The patient is on for a nuclear study.    If this is negative and echo OK I don't suspect that we will be doing any further in patient cardiac work up.       HTN:  Mildly elevated BPs.  Continue current therapy with irbersartan started.    Hypothyroid:  TSH normal.    For questions or updates, please contact [facility] Please consult  [email or web address] for contact info under Cardiology/STEMI.   Signed, Rollene Rotunda, MD  10/30/2020, 10:26 AM

## 2020-10-30 NOTE — Progress Notes (Signed)
  Echocardiogram 2D Echocardiogram has been performed.  Gerda Diss 10/30/2020, 11:20 AM

## 2020-10-30 NOTE — Addendum Note (Signed)
Addended by: Rollene Rotunda on: 10/30/2020 05:35 PM   Modules accepted: Orders

## 2020-11-04 ENCOUNTER — Other Ambulatory Visit: Payer: Self-pay | Admitting: Home Health

## 2020-11-04 DIAGNOSIS — R079 Chest pain, unspecified: Secondary | ICD-10-CM

## 2020-11-12 ENCOUNTER — Telehealth (HOSPITAL_COMMUNITY): Payer: Self-pay | Admitting: *Deleted

## 2020-11-12 ENCOUNTER — Encounter (HOSPITAL_COMMUNITY): Payer: Self-pay | Admitting: Home Health

## 2020-11-12 ENCOUNTER — Telehealth (HOSPITAL_COMMUNITY): Payer: Self-pay | Admitting: Radiology

## 2020-11-12 NOTE — Telephone Encounter (Signed)
Catherine Wang went over instructions with this patient for her upcoming stress test on 11/13/20 @ 10:45. Daneil Dolin

## 2020-11-12 NOTE — Telephone Encounter (Signed)
Patient given detailed instructions per Myocardial Perfusion Study Information Sheet for the test on 11/13/2020 at 10:45 Patient notified to arrive 15 minutes early and that it is imperative to arrive on time for appointment to keep from having the test rescheduled.  If you need to cancel or reschedule your appointment, please call the office within 24 hours of your appointment. . Patient verbalized understanding.EHK

## 2020-11-13 ENCOUNTER — Other Ambulatory Visit: Payer: Self-pay

## 2020-11-13 ENCOUNTER — Ambulatory Visit (HOSPITAL_COMMUNITY): Payer: Medicare Other | Attending: Cardiovascular Disease

## 2020-11-13 DIAGNOSIS — R079 Chest pain, unspecified: Secondary | ICD-10-CM | POA: Diagnosis present

## 2020-11-13 LAB — MYOCARDIAL PERFUSION IMAGING
LV dias vol: 87 mL (ref 46–106)
LV sys vol: 41 mL
Nuc Stress EF: 52 %
Peak HR: 98 {beats}/min
Rest HR: 76 {beats}/min
Rest Nuclear Isotope Dose: 10.4 mCi
SDS: 0
SRS: 0
SSS: 0
ST Depression (mm): 0 mm
Stress Nuclear Isotope Dose: 31.1 mCi
TID: 1.17

## 2020-11-13 MED ORDER — TECHNETIUM TC 99M TETROFOSMIN IV KIT
31.1000 | PACK | Freq: Once | INTRAVENOUS | Status: AC | PRN
  Administered 2020-11-13: 31.1 via INTRAVENOUS
  Filled 2020-11-13: qty 32

## 2020-11-13 MED ORDER — TECHNETIUM TC 99M TETROFOSMIN IV KIT
10.4000 | PACK | Freq: Once | INTRAVENOUS | Status: AC | PRN
  Administered 2020-11-13: 10.4 via INTRAVENOUS
  Filled 2020-11-13: qty 11

## 2020-11-13 MED ORDER — REGADENOSON 0.4 MG/5ML IV SOLN
0.4000 mg | Freq: Once | INTRAVENOUS | Status: AC
  Administered 2020-11-13: 0.4 mg via INTRAVENOUS

## 2020-11-16 ENCOUNTER — Other Ambulatory Visit: Payer: Self-pay

## 2020-11-16 ENCOUNTER — Ambulatory Visit (INDEPENDENT_AMBULATORY_CARE_PROVIDER_SITE_OTHER): Payer: Medicare Other | Admitting: Physician Assistant

## 2020-11-16 VITALS — BP 152/80 | HR 80 | Ht 64.0 in | Wt 210.4 lb

## 2020-11-16 DIAGNOSIS — I10 Essential (primary) hypertension: Secondary | ICD-10-CM | POA: Diagnosis not present

## 2020-11-16 DIAGNOSIS — R0789 Other chest pain: Secondary | ICD-10-CM | POA: Diagnosis not present

## 2020-11-16 NOTE — Progress Notes (Signed)
Cardiology Office Note:    Date:  11/18/2020   ID:  Catherine Wang, DOB 1945-11-27, MRN [id number]  PCP:  Deborah Chalk, MD   [facility] Providers Cardiologist:  Rollene Rotunda, MD     Referring MD: No ref. provider found   Chief Complaint  Patient presents with   Follow-up    Seen for Dr. Antoine Poche    History of Present Illness:    Catherine Wang is a 75 y.o. female with a hx of mixed connective tissue disease lupus type treated with hydroxychloroquine, hypertension and GERD.  Patient recently presented to the hospital on 10/29/2020 with right-sided anterior chest pain and mild shortness of breath.  Her pain is worse with deep inspiration when laying flat.  While in the ED, she was given sublingual nitroglycerin with improvement.  EKG shows sinus rhythm with no acute ST-T wave changes.  Serial troponin was negative which is inconsistent with CAD given the prolonged severe chest pain.  Chest x-ray with intermittent rounded density located between the left lateral trachea and the aortic arch.  Follow-up CTA negative for mass or adenopathy to correspond to the described left paratracheal density on the chest x-ray.  There is scattered atelectasis in the lung, greatest in the right lower lobe.  She was noted to have bovine arch variant.  There is also scattered atherosclerotic calcification including coronary arteries.  Echocardiogram obtained on 10/30/2020 showed EF 60 to 65%, grade 1 DD, no significant valve issue.  She was eventually discharged to have outpatient Myoview on 11/13/2020 which also came back normal, EF 52%, no evidence of prior infarction or ischemia.  Patient presents today for post hospital follow-up along with her husband.  She continues to have some tenderness under the right breast, however this appears to be musculoskeletal.  It was worse with palpation and deep inspiration.  I reviewed the recent echocardiogram and stress test results with the patient.  Given the  atypical nature of her symptoms, I did not recommend any further work-up.  Her blood pressure is elevated in the office today, even on manual recheck by myself, her blood pressure is still 156/80.  I recommend that she follow-up on her blood pressure reading for the next week, if her systolic blood pressures persistently greater than 140 mmHg, she will need to increase olmesartan to 40 mg daily and to give [place] a call so we can send in more prescription.  Otherwise, I will schedule the patient to follow-up with Dr. Antoine Poche in 6 months given history of coronary and aortic calcification.   Past Medical History:  Diagnosis Date   GERD (gastroesophageal reflux disease)    Hypertension    Mixed connective tissue disease (HCC)     Past Surgical History:  Procedure Laterality Date   ABDOMINAL HYSTERECTOMY     HEMORROIDECTOMY      Current Medications: Current Meds  Medication Sig   amLODipine (NORVASC) 5 MG tablet Take 5 mg by mouth daily.   ARTIFICIAL TEAR OP Place 1 drop into both eyes daily as needed (For dry eyes).   aspirin 81 MG chewable tablet Chew 81 mg by mouth daily.   azelastine (ASTELIN) 0.1 % nasal spray Place 1 spray into both nostrils 2 (two) times daily.   Cholecalciferol 25 MCG (1000 UT) capsule Take 1,000 Units by mouth daily.   Ferrous Sulfate (IRON PO) Take 1 tablet by mouth daily.   fluticasone-salmeterol (ADVAIR HFA) 230-21 MCG/ACT inhaler Inhale 2 puffs into the lungs 2 (two)  times daily.   hydroxychloroquine (PLAQUENIL) 200 MG tablet Take 200-400 mg by mouth See admin instructions. Alternate days. Patient states she takes one tablet by mouth daily and then take two tablets by mouth the next day per patient. Patient stated she taken two tablets by mouth yesterday(10-27-20)   levothyroxine (SYNTHROID) 88 MCG tablet Take 88 mcg by mouth daily before breakfast.   montelukast (SINGULAIR) 10 MG tablet Take 10 mg by mouth at bedtime.   olmesartan (BENICAR) 20 MG tablet Take 20 mg  by mouth daily.   Omega-3 Fatty Acids (FISH OIL PO) Take 1 tablet by mouth daily.   pantoprazole (PROTONIX) 40 MG tablet Take 40 mg by mouth daily.   sertraline (ZOLOFT) 25 MG tablet Take 25 mg by mouth daily.     Allergies:   Patient has no known allergies.   Social History   Socioeconomic History   Marital status: Married    Spouse name: Not on file   Number of children: Not on file   Years of education: Not on file   Highest education level: Not on file  Occupational History   Not on file  Tobacco Use   Smoking status: Never   Smokeless tobacco: Not on file  Substance and Sexual Activity   Alcohol use: Never   Drug use: Never   Sexual activity: Not on file  Other Topics Concern   Not on file  Social History Narrative   Lives with husband.    Social Determinants of Health   Financial Resource Strain: Not on file  Food Insecurity: Not on file  Transportation Needs: Not on file  Physical Activity: Not on file  Stress: Not on file  Social Connections: Not on file     Family History: The patient's family history includes Heart failure in her mother.  ROS:   Please see the history of present illness.     All other systems reviewed and are negative.  EKGs/Labs/Other Studies Reviewed:    The following studies were reviewed today:  Echo 10/30/2020  1. Left ventricular ejection fraction, by estimation, is 60 to 65%. The  left ventricle has normal function. The left ventricle has no regional  wall motion abnormalities. Left ventricular diastolic parameters are  consistent with Grade I diastolic  dysfunction (impaired relaxation).   2. Right ventricular systolic function is normal. The right ventricular  size is normal.   3. The mitral valve is normal in structure. No evidence of mitral valve  regurgitation. No evidence of mitral stenosis.   4. The aortic valve is tricuspid. Aortic valve regurgitation is not  visualized. Aortic valve sclerosis is present, with no  evidence of aortic  valve stenosis.   5. The inferior vena cava is normal in size with greater than 50%  respiratory variability, suggesting right atrial pressure of 3 mmHg.   Comparison(s): No prior Echocardiogram.    Myoview 11/13/2020   No ST deviation was noted.   LV perfusion is normal. There is no evidence of ischemia. There is no evidence of infarction.   Left ventricular function is normal. Nuclear stress EF: 52 %. The left ventricular ejection fraction appears normal   Normal perfusion EF estimated at 52%  EKG:  EKG is not ordered today.    Recent Labs: 10/28/2020: ALT 33; BUN 10; Creatinine, Ser 0.58; Hemoglobin 12.6; Platelets 241; Potassium 4.3; Sodium 137 10/29/2020: TSH 1.183  Recent Lipid Panel    Component Value Date/Time   CHOL 172 10/29/2020 1522   TRIG  152 (H) 10/29/2020 1522   HDL 58 10/29/2020 1522   CHOLHDL 3.0 10/29/2020 1522   VLDL 30 10/29/2020 1522   LDLCALC 84 10/29/2020 1522     Risk Assessment/Calculations:           Physical Exam:    VS:  BP (!) 152/80   Pulse 80   Ht 5\' 4"  (1.626 m)   Wt 210 lb 6.4 oz (95.4 kg)   LMP  (LMP Unknown)   SpO2 96%   BMI 36.12 kg/m     Wt Readings from Last 3 Encounters:  11/16/20 210 lb 6.4 oz (95.4 kg)  11/13/20 206 lb (93.4 kg)  10/30/20 206 lb 12.8 oz (93.8 kg)     GEN:  Well nourished, well developed in no acute distress HEENT: Normal NECK: No JVD; No carotid bruits LYMPHATICS: No lymphadenopathy CARDIAC: RRR, no murmurs, rubs, gallops RESPIRATORY:  Clear to auscultation without rales, wheezing or rhonchi  ABDOMEN: Soft, non-tender, non-distended MUSCULOSKELETAL:  No edema; No deformity  SKIN: Warm and dry NEUROLOGIC:  Alert and oriented x 3 PSYCHIATRIC:  Normal affect   ASSESSMENT:    1. Atypical chest pain   2. Primary hypertension    PLAN:    In order of problems listed above:  Atypical chest pain: Recent echocardiogram and Myoview were normal.  No further work-up is needed.  She  continues to have tenderness under the right breast, this is likely musculoskeletal pain  Hypertension: Blood pressure is elevated today, however I do not have any any previous blood pressure to compare.  I asked her to continue to monitor her home blood pressure and if her systolic blood pressures persistently greater than 140 mmHg, she has been instructed to increase olmesartan to 40 mg daily and to contact cardiology service so we can send in a new prescription.        Medication Adjustments/Labs and Tests Ordered: Current medicines are reviewed at length with the patient today.  Concerns regarding medicines are outlined above.  No orders of the defined types were placed in this encounter.  No orders of the defined types were placed in this encounter.   Patient Instructions  Medication Instructions:  Your physician recommends that you continue on your current medications as directed. Please refer to the Current Medication list given to you today.  *If you need a refill on your cardiac medications before your next appointment, please call your pharmacy*  Lab Work: NONE ordered at this time of appointment   If you have labs (blood work) drawn today and your tests are completely normal, you will receive your results only by: MyChart Message (if you have MyChart) OR A paper copy in the mail If you have any lab test that is abnormal or we need to change your treatment, we will call you to review the results.  Testing/Procedures: NONE ordered at this time of appointment   Follow-Up: At [facility], you and your health needs are our priority.  As part of our continuing mission to provide you with exceptional heart care, we have created designated Provider Care Teams.  These Care Teams include your primary Cardiologist (physician) and Advanced Practice Providers (APPs -  Physician Assistants and Nurse Practitioners) who all work together to provide you with the care you need, when you  need it.  We recommend signing up for the patient portal called "MyChart".  Sign up information is provided on this After Visit Summary.  MyChart is used to connect with patients for  Virtual Visits (Telemedicine).  Patients are able to view lab/test results, encounter notes, upcoming appointments, etc.  Non-urgent messages can be sent to your provider as well.   To learn more about what you can do with MyChart, go to [email or web address].    Your next appointment:   6 month(s)  The format for your next appointment:   In Person  Provider:   Rollene Rotunda, MD  Other Instructions Monitor blood pressure at home for 1 week. Take your blood pressure 2 hours after taking morning medications, if your Systolic (Top number) blood pressure is persistently greater then 140 you may INCREASE Olmesartan to 40 mg (2 tablets) daily. Give our office a call informing us that you increased your medication and a new prescription/refill will be called into your local pharmacy.   If you have to increase Olmesartan please continue to monitor your blood pressure daily for 1-2 weeks if your top number is persistently greater than 140 please give our office a call    Signed, Azalee Course, PA  11/18/2020 9:03 PM    [facility]

## 2020-11-16 NOTE — Patient Instructions (Addendum)
Medication Instructions:  Your physician recommends that you continue on your current medications as directed. Please refer to the Current Medication list given to you today.  *If you need a refill on your cardiac medications before your next appointment, please call your pharmacy*  Lab Work: NONE ordered at this time of appointment   If you have labs (blood work) drawn today and your tests are completely normal, you will receive your results only by: MyChart Message (if you have MyChart) OR A paper copy in the mail If you have any lab test that is abnormal or we need to change your treatment, we will call you to review the results.  Testing/Procedures: NONE ordered at this time of appointment   Follow-Up: At [facility], you and your health needs are our priority.  As part of our continuing mission to provide you with exceptional heart care, we have created designated Provider Care Teams.  These Care Teams include your primary Cardiologist (physician) and Advanced Practice Providers (APPs -  Physician Assistants and Nurse Practitioners) who all work together to provide you with the care you need, when you need it.  We recommend signing up for the patient portal called "MyChart".  Sign up information is provided on this After Visit Summary.  MyChart is used to connect with patients for Virtual Visits (Telemedicine).  Patients are able to view lab/test results, encounter notes, upcoming appointments, etc.  Non-urgent messages can be sent to your provider as well.   To learn more about what you can do with MyChart, go to [email or web address].    Your next appointment:   6 month(s)  The format for your next appointment:   In Person  Provider:   Rollene Rotunda, MD  Other Instructions Monitor blood pressure at home for 1 week. Take your blood pressure 2 hours after taking morning medications, if your Systolic (Top number) blood pressure is persistently greater then 140 you may  INCREASE Olmesartan to 40 mg (2 tablets) daily. Give our office a call informing us that you increased your medication and a new prescription/refill will be called into your local pharmacy.   If you have to increase Olmesartan please continue to monitor your blood pressure daily for 1-2 weeks if your top number is persistently greater than 140 please give our office a call

## 2020-11-18 ENCOUNTER — Encounter: Payer: Self-pay | Admitting: Physician Assistant

## 2021-03-16 ENCOUNTER — Ambulatory Visit: Payer: Medicare Other | Admitting: Physician Assistant

## 2021-04-29 ENCOUNTER — Ambulatory Visit: Payer: Medicare Other | Admitting: Cardiology

## 2022-08-28 IMAGING — CR DG CHEST 2V
2 series · 2 of 2 positions shown · non-contrast
Comparison: Chest CT dated December 09, 2002

CLINICAL DATA: Chest pain

EXAM:
CHEST - 2 VIEW

[chest pa]
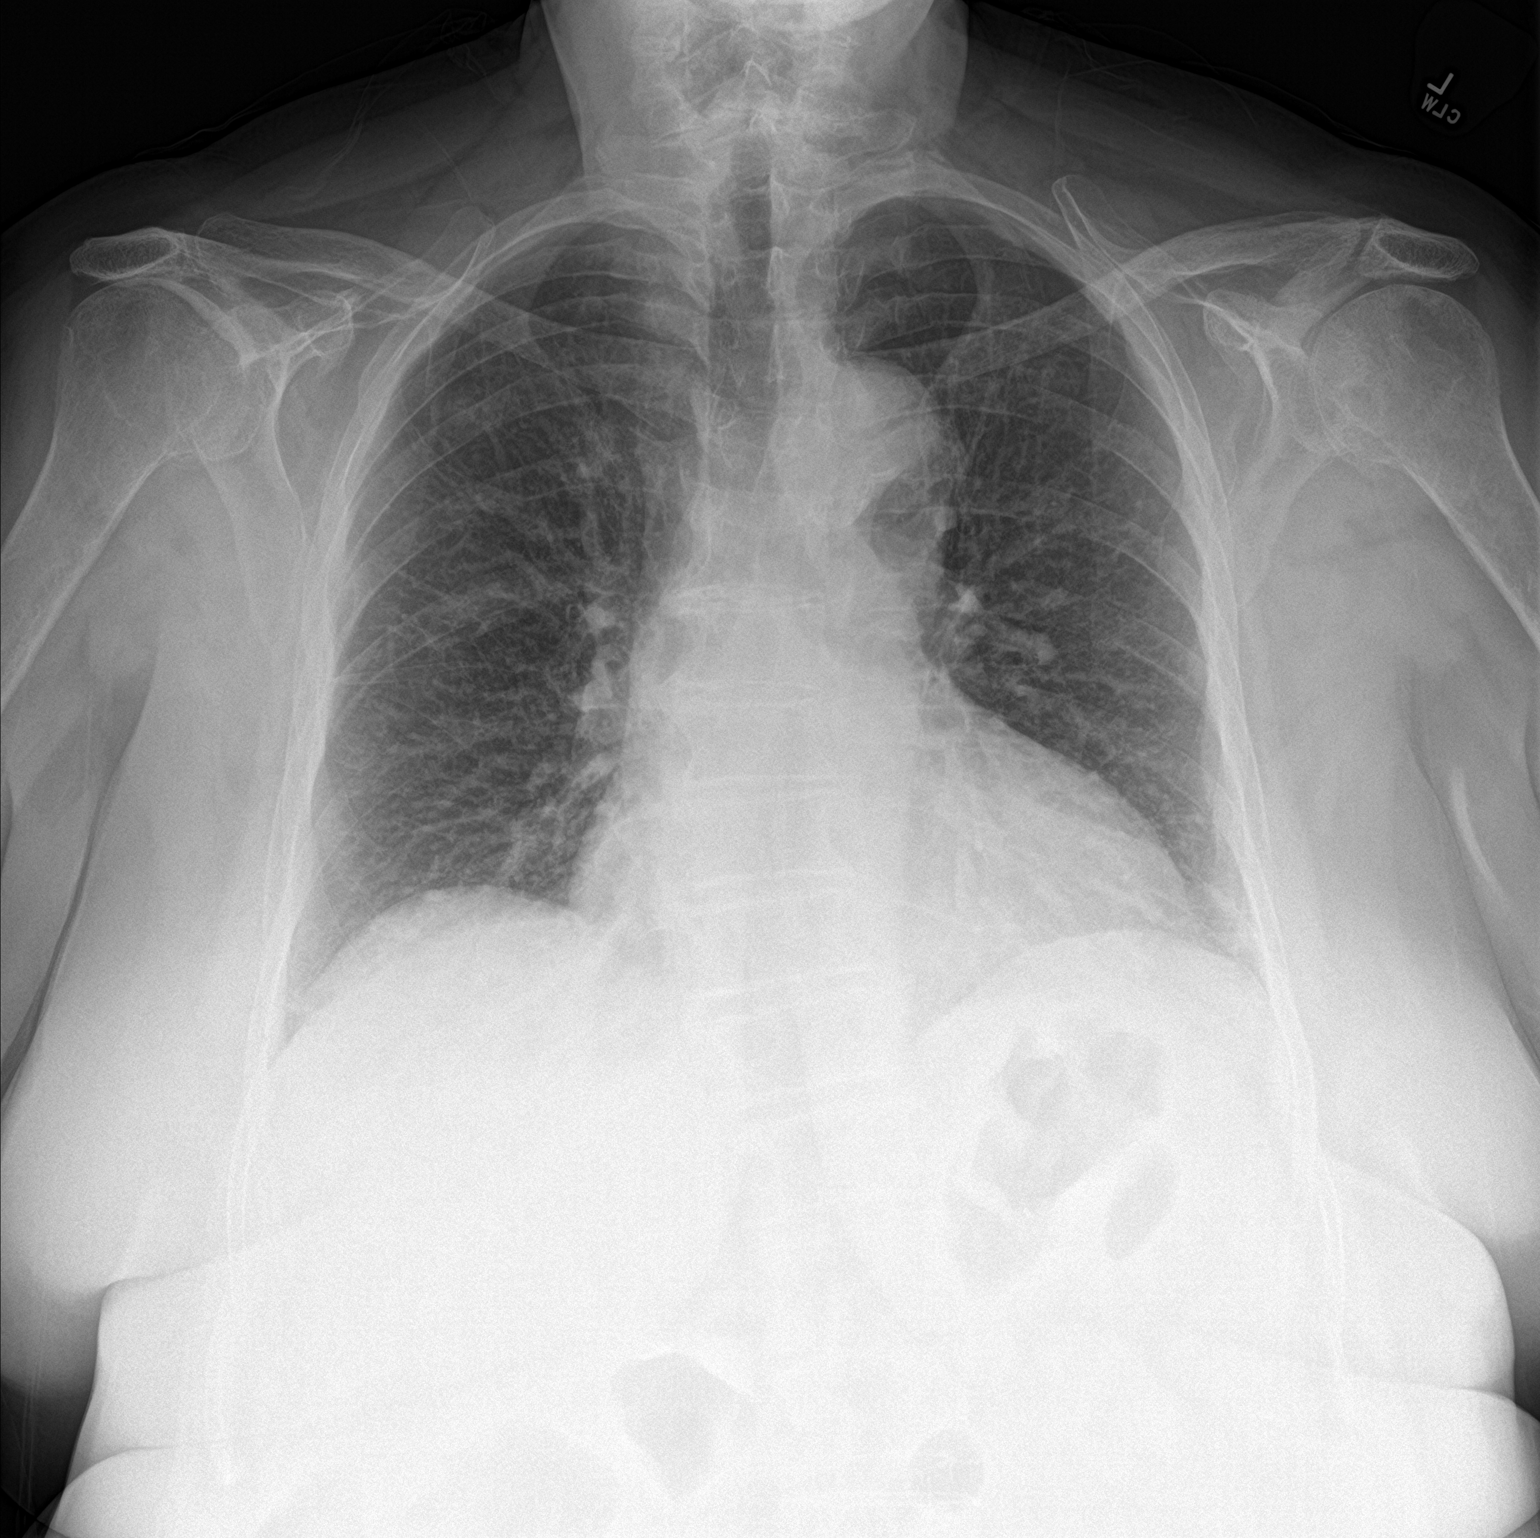

[chest lat]
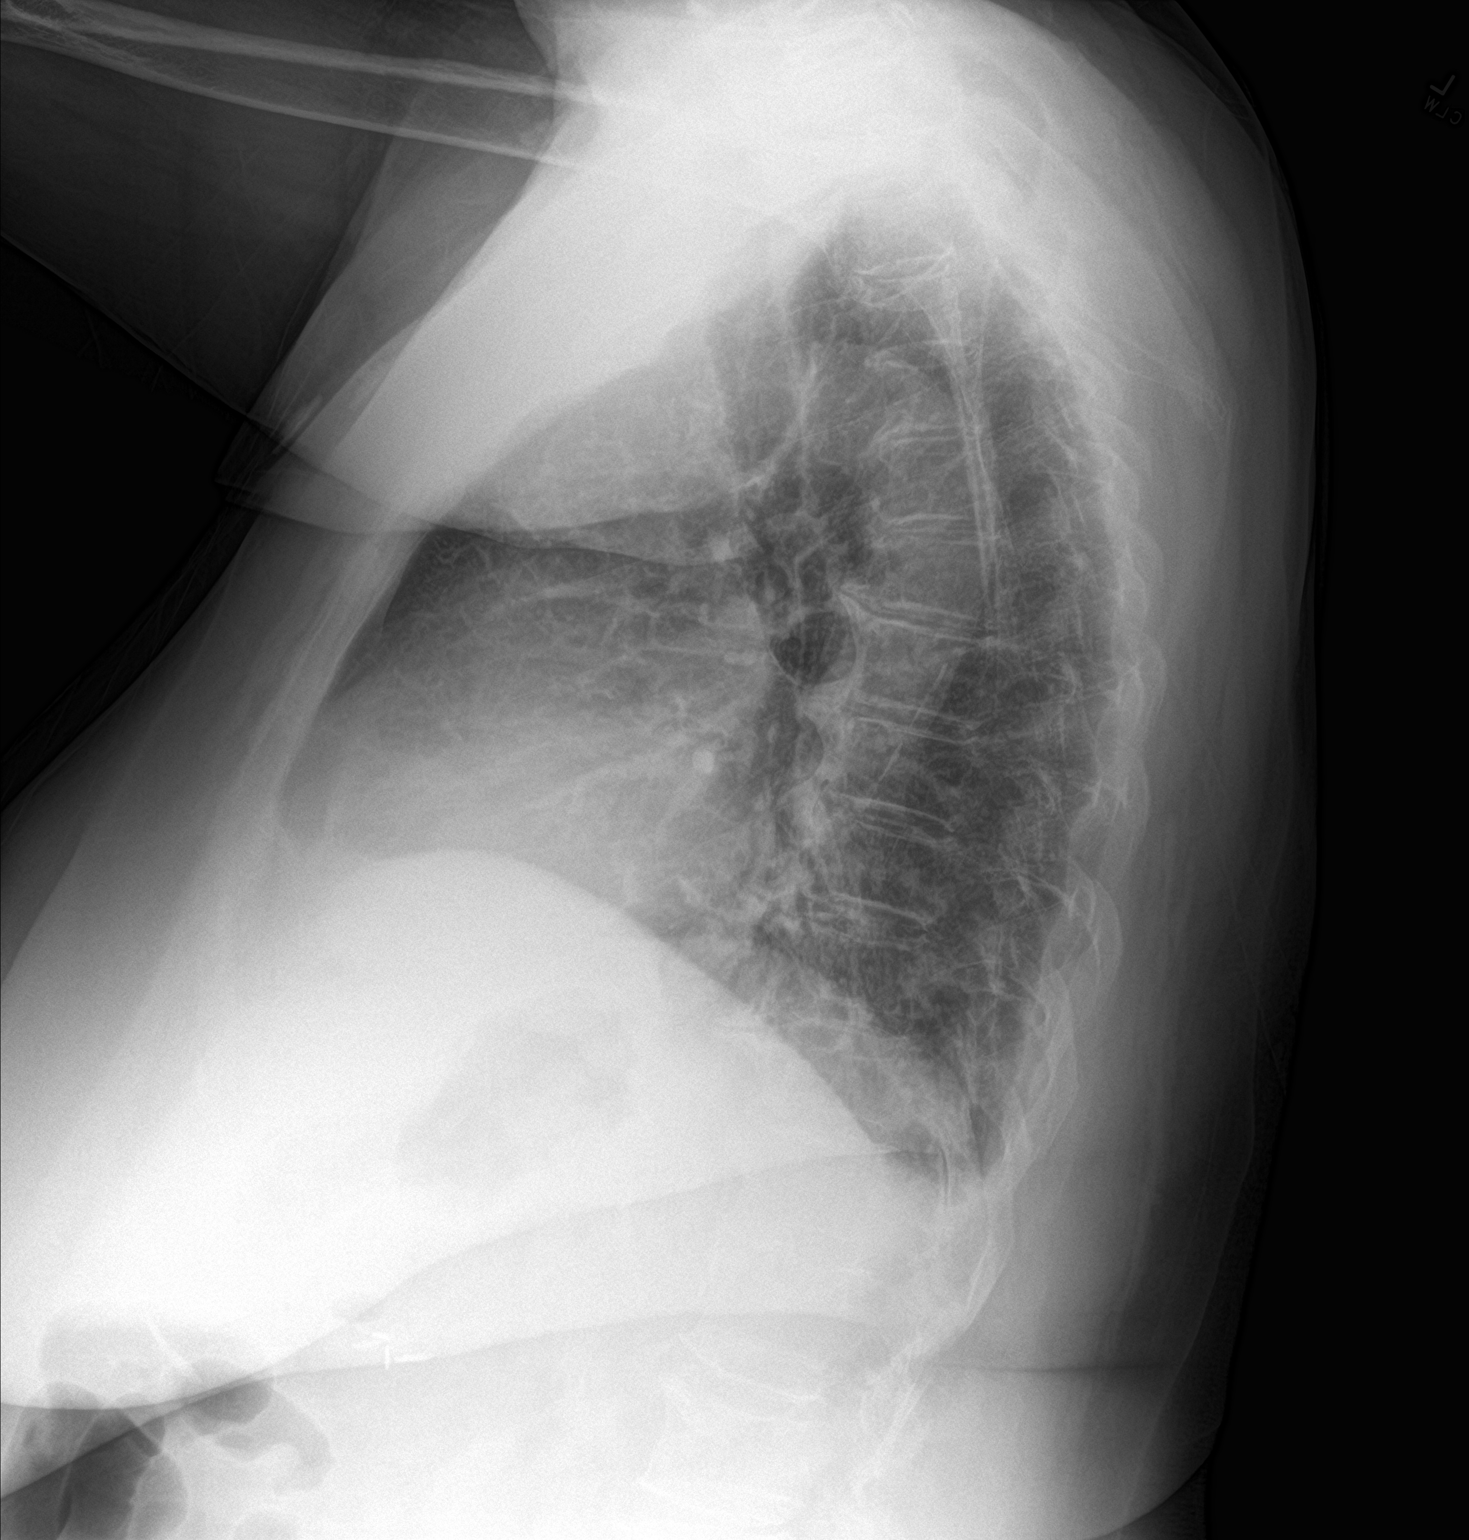

[2 of 2 positions shown; findings below may reference images not displayed]

FINDINGS: Heart is upper limits of normal in size. Indeterminate rounded
density located between the left lateral trachea and aortic arch.
Bibasilar atelectasis. No focal consolidation. Blunting of the
bilateral costophrenic angles possibly due to trace pleural
effusions versus pleural thickening.
IMPRESSION: Indeterminate rounded density located between the left lateral
trachea and aortic arch. Recommend further evaluation with chest CT.

## 2022-08-28 IMAGING — CT CT ANGIO CHEST
2 of 7 series · 17 of 46 positions shown · IV contrast (APPLIED)
Comparison: CT chest 01/09/2003
COMPARISON: CT chest 01/09/2003

Addendum:
CLINICAL DATA: RIGHT-side chest pain since [REDACTED], some pain goes
to back, no history of blood clots. History hypertension, asthma

EXAM:
CT ANGIOGRAPHY CHEST WITH CONTRAST
TECHNIQUE: Multidetector CT imaging of the chest was performed using the
standard protocol during bolus administration of intravenous
contrast. Multiplanar CT image reconstructions and MIPs were
obtained to evaluate the vascular anatomy.
CONTRAST:  75mL OMNIPAQUE IOHEXOL 350 MG/ML SOLN IV

[Series 7: thins · axial · 0.64mm/px · z∈[-191,+33]mm · 14 of 361 slices shown]
[im 21/361  lung]
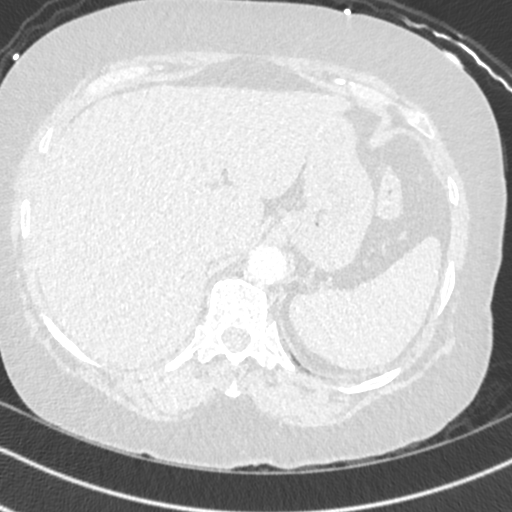
[im 41/361  soft-tissue]
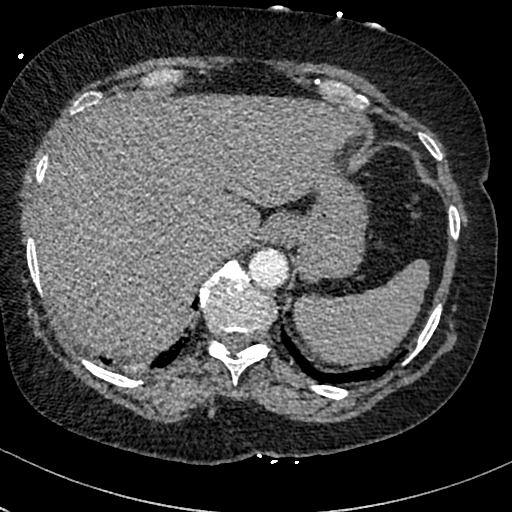
[im 81/361  lung]
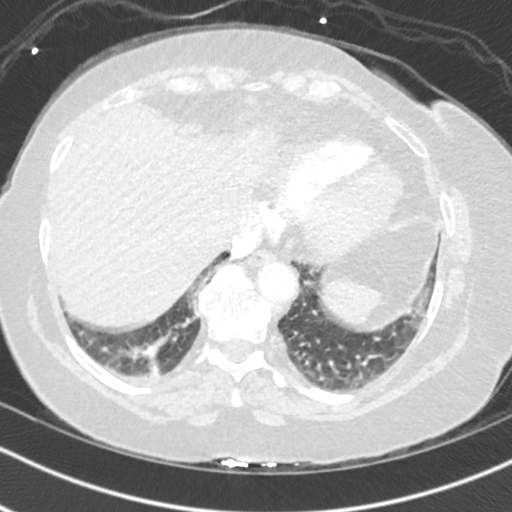
[im 101/361  soft-tissue]
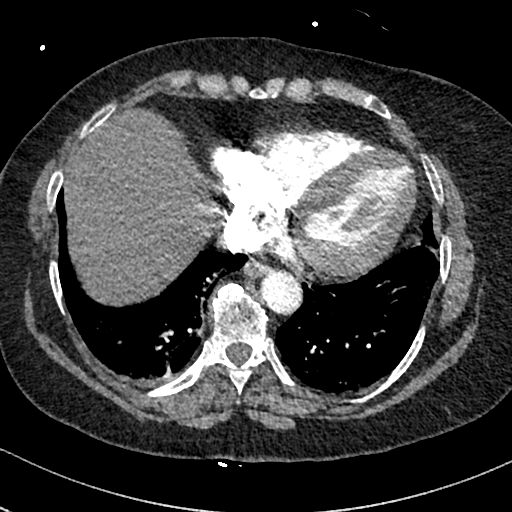
[im 121/361  lung]
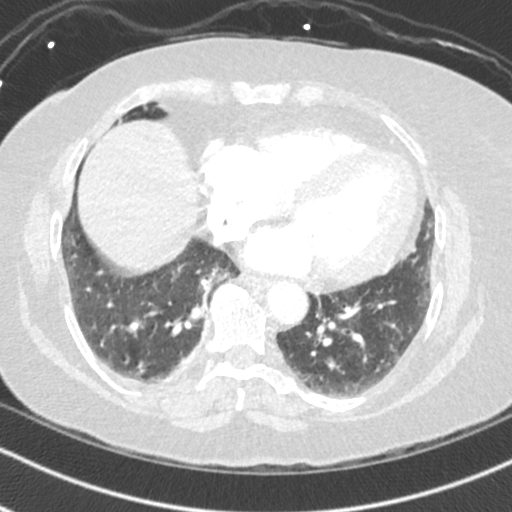
[im 141/361  soft-tissue]
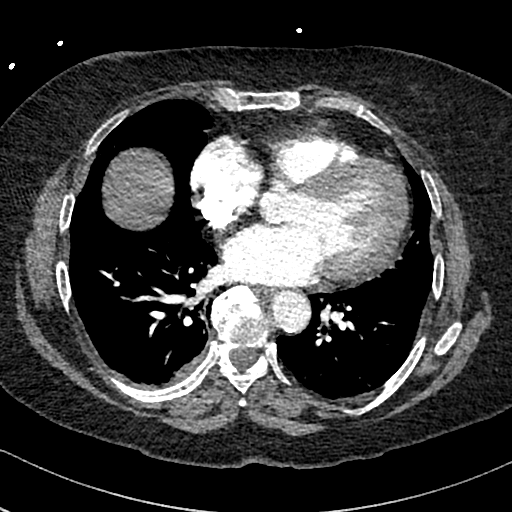
[im 161/361  lung]
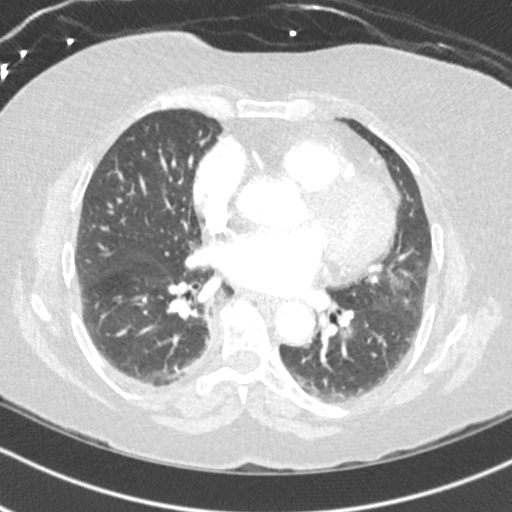
[im 201/361  soft-tissue]
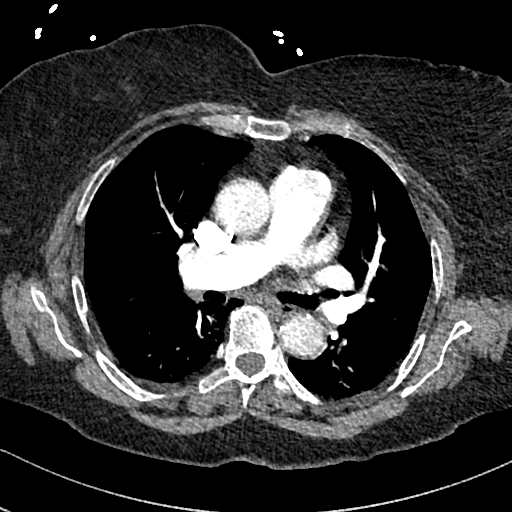
[im 221/361  lung]
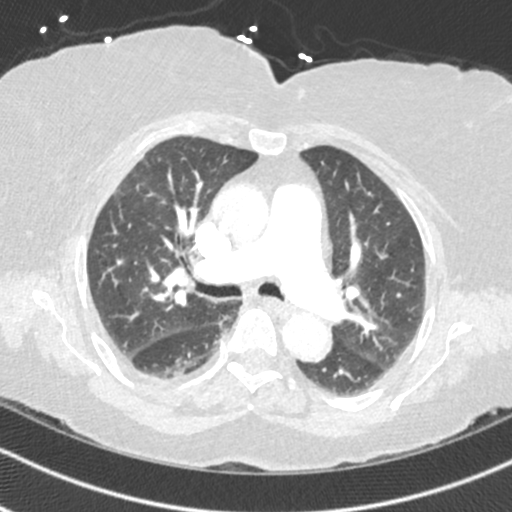
[im 241/361  soft-tissue]
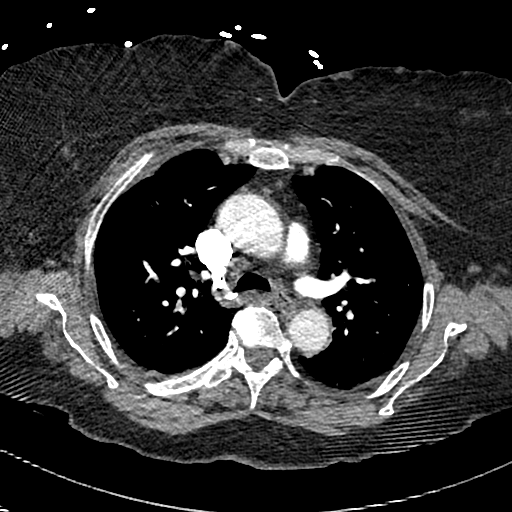
[im 261/361  lung]
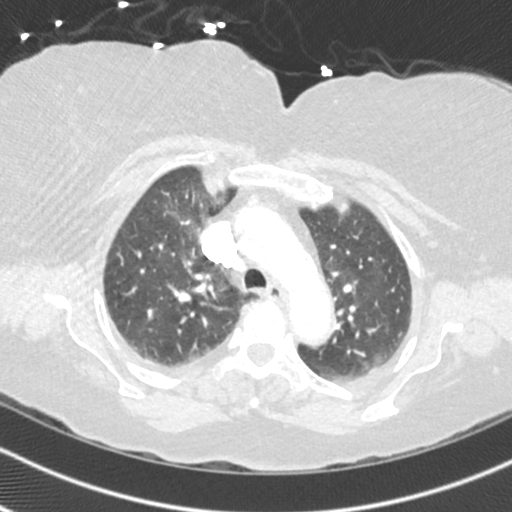
[im 281/361  soft-tissue]
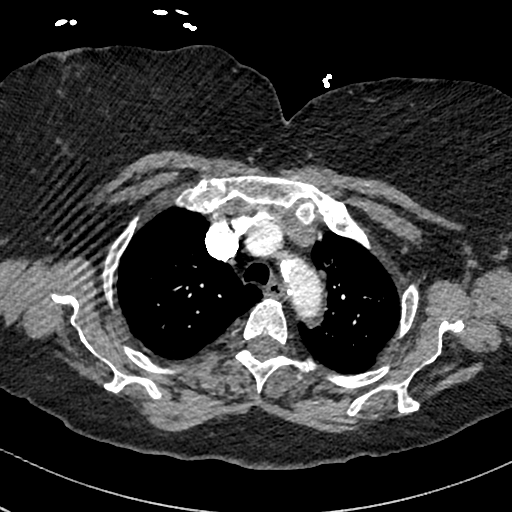
[im 321/361  lung]
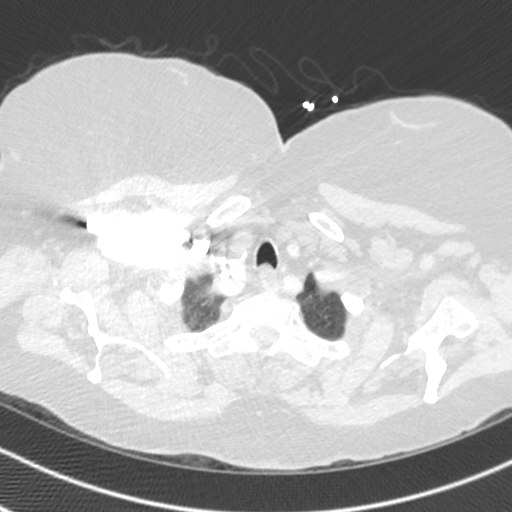
[im 341/361  soft-tissue]
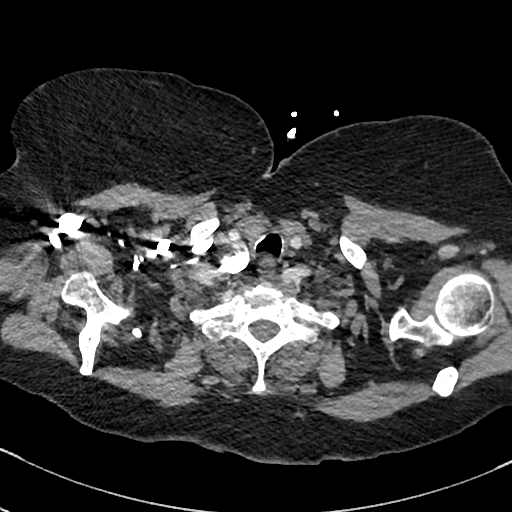

[Series 8: cor · coronal · 0.50mm/px · 3 of 148 slices shown]
[im 37/148  soft-tissue]
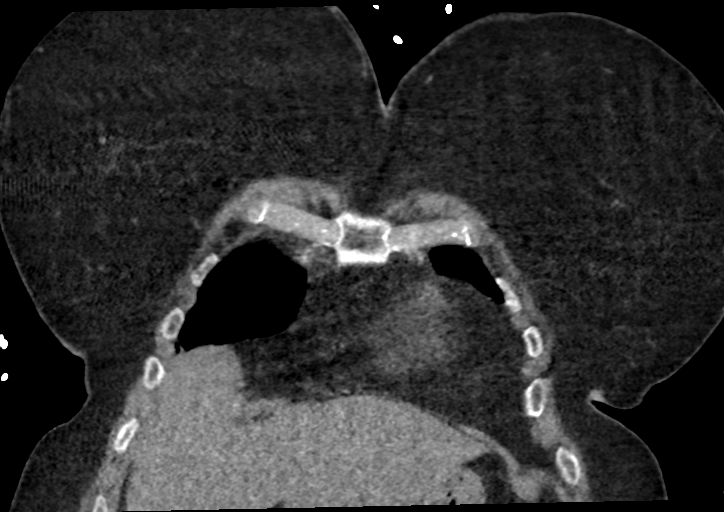
[im 74/148  soft-tissue]
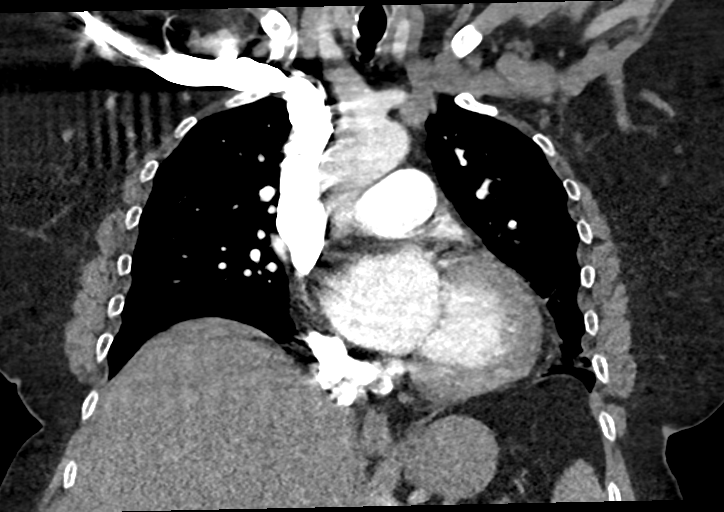
[im 111/148  soft-tissue]
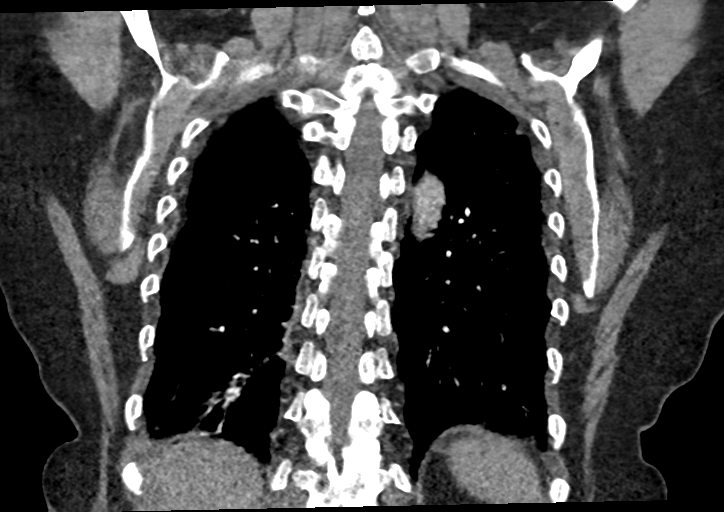

[17 of 46 positions shown; findings below may reference images not displayed]

FINDINGS: Cardiovascular: Atherosclerotic calcifications aorta, coronary
arteries, proximal great vessels. And normal caliber without
aneurysm or dissection. Heart unremarkable. No pericardial effusion.
Pulmonary arteries well opacified and patent. No evidence of
pulmonary embolism.

Mediastinum/Nodes: Esophagus unremarkable. No thoracic adenopathy.
Base of cervical region normal appearance.

Lungs/Pleura: Dependent atelectasis in lower lobes greater on RIGHT.
Minimal atelectasis RIGHT upper lobe. Lungs otherwise clear. No
definite infiltrate, pleural effusion, or pneumothorax.

Upper Abdomen: Visualized upper abdomen unremarkable.

Musculoskeletal: Cystic degenerative changes at LEFT glenoid. No
acute osseous findings.

Review of the MIP images confirms the above findings.
IMPRESSION: No evidence of pulmonary embolism.

Scattered atelectasis in the lungs greatest in RIGHT lower lobe.

Scattered atherosclerotic calcifications including coronary
arteries.

Aortic Atherosclerosis ([id number]-PRJ.J).

ADDENDUM:
Request made to specifically address the abnormal finding on
radiography. There is no mass or adenopathy to correspond to the
described left paratracheal opacity on radiography. Suspect that the
finding was due to confluence of vascular shadows. Patient is noted
to have a bovine arch variant.

*** End of Addendum ***
FINDINGS: Cardiovascular: Atherosclerotic calcifications aorta, coronary
arteries, proximal great vessels. And normal caliber without
aneurysm or dissection. Heart unremarkable. No pericardial effusion.
Pulmonary arteries well opacified and patent. No evidence of
pulmonary embolism.

Mediastinum/Nodes: Esophagus unremarkable. No thoracic adenopathy.
Base of cervical region normal appearance.

Lungs/Pleura: Dependent atelectasis in lower lobes greater on RIGHT.
Minimal atelectasis RIGHT upper lobe. Lungs otherwise clear. No
definite infiltrate, pleural effusion, or pneumothorax.

Upper Abdomen: Visualized upper abdomen unremarkable.

Musculoskeletal: Cystic degenerative changes at LEFT glenoid. No
acute osseous findings.

Review of the MIP images confirms the above findings.
IMPRESSION: No evidence of pulmonary embolism.

Scattered atelectasis in the lungs greatest in RIGHT lower lobe.

Scattered atherosclerotic calcifications including coronary
arteries.

Aortic Atherosclerosis (IEJ6L-PRJ.J).

## 2023-12-27 ENCOUNTER — Other Ambulatory Visit (HOSPITAL_BASED_OUTPATIENT_CLINIC_OR_DEPARTMENT_OTHER): Payer: Self-pay | Admitting: Family Medicine

## 2023-12-27 DIAGNOSIS — E2839 Other primary ovarian failure: Secondary | ICD-10-CM

## 2023-12-28 ENCOUNTER — Telehealth (HOSPITAL_BASED_OUTPATIENT_CLINIC_OR_DEPARTMENT_OTHER): Payer: Self-pay

## 2024-01-05 ENCOUNTER — Telehealth (HOSPITAL_BASED_OUTPATIENT_CLINIC_OR_DEPARTMENT_OTHER): Payer: Self-pay
